# Patient Record
Sex: Male | Born: 1995
Health system: Southern US, Community
[De-identification: ages and names within clinical notes are randomized; demographics above are authoritative.]

## PROBLEM LIST (undated history)

## (undated) DIAGNOSIS — K3532 Acute appendicitis with perforation and localized peritonitis, without abscess: Secondary | ICD-10-CM

## (undated) HISTORY — DX: Acute appendicitis with perforation, localized peritonitis, and gangrene, without abscess: K35.32

## (undated) HISTORY — PX: APPENDECTOMY: SHX54

---
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
## 2017-03-27 DIAGNOSIS — Z23 Encounter for immunization: Secondary | ICD-10-CM | POA: Diagnosis not present

## 2017-09-24 ENCOUNTER — Other Ambulatory Visit: Payer: Self-pay | Admitting: Family Medicine

## 2017-09-24 ENCOUNTER — Ambulatory Visit
Admission: RE | Admit: 2017-09-24 | Discharge: 2017-09-24 | Disposition: A | Discharge status: Home / Self Care | Payer: Commercial Managed Care - PPO | Source: Ambulatory Visit | Attending: Family Medicine | Admitting: Family Medicine

## 2017-09-24 ENCOUNTER — Other Ambulatory Visit: Payer: Self-pay

## 2017-09-24 ENCOUNTER — Encounter (HOSPITAL_COMMUNITY): Payer: Self-pay | Admitting: Emergency Medicine

## 2017-09-24 ENCOUNTER — Inpatient Hospital Stay (HOSPITAL_COMMUNITY)
Admission: EM | Admit: 2017-09-24 | Discharge: 2017-09-29 | DRG: 372 | Disposition: A | Discharge status: Home / Self Care | Payer: Commercial Managed Care - PPO | Attending: General Surgery | Admitting: General Surgery

## 2017-09-24 ENCOUNTER — Observation Stay (HOSPITAL_COMMUNITY): Payer: Commercial Managed Care - PPO

## 2017-09-24 DIAGNOSIS — K3533 Acute appendicitis with perforation and localized peritonitis, with abscess: Principal | ICD-10-CM | POA: Diagnosis present

## 2017-09-24 DIAGNOSIS — K567 Ileus, unspecified: Secondary | ICD-10-CM | POA: Diagnosis present

## 2017-09-24 DIAGNOSIS — R188 Other ascites: Secondary | ICD-10-CM | POA: Diagnosis present

## 2017-09-24 DIAGNOSIS — R1031 Right lower quadrant pain: Secondary | ICD-10-CM

## 2017-09-24 DIAGNOSIS — K3589 Other acute appendicitis without perforation or gangrene: Secondary | ICD-10-CM

## 2017-09-24 DIAGNOSIS — K3532 Acute appendicitis with perforation and localized peritonitis, without abscess: Secondary | ICD-10-CM | POA: Diagnosis present

## 2017-09-24 DIAGNOSIS — L02211 Cutaneous abscess of abdominal wall: Secondary | ICD-10-CM | POA: Diagnosis not present

## 2017-09-24 LAB — CBC WITH DIFFERENTIAL/PLATELET
BASOS PCT: 0 %
Basophils Absolute: 0 10*3/uL (ref 0.0–0.1)
EOS ABS: 0 10*3/uL (ref 0.0–0.7)
Eosinophils Relative: 0 %
HCT: 42.6 % (ref 39.0–52.0)
HEMOGLOBIN: 14.8 g/dL (ref 13.0–17.0)
Lymphocytes Relative: 6 %
Lymphs Abs: 0.8 10*3/uL (ref 0.7–4.0)
MCH: 30.3 pg (ref 26.0–34.0)
MCHC: 34.7 g/dL (ref 30.0–36.0)
MCV: 87.1 fL (ref 78.0–100.0)
MONO ABS: 1.4 10*3/uL — AB (ref 0.1–1.0)
MONOS PCT: 11 %
Neutro Abs: 11.4 10*3/uL — ABNORMAL HIGH (ref 1.7–7.7)
Neutrophils Relative %: 83 %
PLATELETS: 245 10*3/uL (ref 150–400)
RBC: 4.89 MIL/uL (ref 4.22–5.81)
RDW: 11.6 % (ref 11.5–15.5)
WBC: 13.7 10*3/uL — ABNORMAL HIGH (ref 4.0–10.5)

## 2017-09-24 LAB — BASIC METABOLIC PANEL
Anion gap: 16 — ABNORMAL HIGH (ref 5–15)
BUN: 7 mg/dL (ref 6–20)
CALCIUM: 9.2 mg/dL (ref 8.9–10.3)
CO2: 21 mmol/L — ABNORMAL LOW (ref 22–32)
CREATININE: 0.76 mg/dL (ref 0.61–1.24)
Chloride: 100 mmol/L — ABNORMAL LOW (ref 101–111)
Glucose, Bld: 100 mg/dL — ABNORMAL HIGH (ref 65–99)
Potassium: 3.5 mmol/L (ref 3.5–5.1)
SODIUM: 137 mmol/L (ref 135–145)

## 2017-09-24 LAB — PROTIME-INR
INR: 1.09
Prothrombin Time: 14 s (ref 11.4–15.2)

## 2017-09-24 LAB — CREATININE, SERUM
Creatinine, Ser: 0.76 mg/dL (ref 0.61–1.24)
GFR calc Af Amer: >60 mL/min (ref >60)
GFR calc non Af Amer: >60 mL/min (ref >60)

## 2017-09-24 MED ORDER — MORPHINE SULFATE (PF) 2 MG/ML IV SOLN
1.0000 mg | INTRAVENOUS | Status: DC | PRN
  Filled 2017-09-24: qty 1

## 2017-09-24 MED ORDER — ACETAMINOPHEN 325 MG PO TABS
650.0000 mg | ORAL_TABLET | Freq: Four times a day (QID) | ORAL | Status: DC | PRN
  Administered 2017-09-25: 650 mg via ORAL

## 2017-09-24 MED ORDER — SODIUM CHLORIDE 0.45 % IV SOLN
INTRAVENOUS | Status: DC
  Administered 2017-09-24: 18:00:00 via INTRAVENOUS
  Administered 2017-09-25: 125 mL/h via INTRAVENOUS
  Administered 2017-09-27 – 2017-09-28 (×2): via INTRAVENOUS

## 2017-09-24 MED ORDER — SIMETHICONE 80 MG PO CHEW
40.0000 mg | CHEWABLE_TABLET | Freq: Four times a day (QID) | ORAL | Status: DC | PRN
  Filled 2017-09-24: qty 1

## 2017-09-24 MED ORDER — IOPAMIDOL (ISOVUE-300) INJECTION 61%
100.0000 mL | Freq: Once | INTRAVENOUS | Status: AC | PRN
  Administered 2017-09-24: 100 mL via INTRAVENOUS

## 2017-09-24 MED ORDER — SODIUM CHLORIDE 0.9% FLUSH
5.0000 mL | Freq: Three times a day (TID) | INTRAVENOUS | Status: DC
  Administered 2017-09-24 – 2017-09-29 (×13): 5 mL

## 2017-09-24 MED ORDER — PIPERACILLIN-TAZOBACTAM 3.375 G IVPB 30 MIN: 3.3750 g | Freq: Three times a day (TID) | INTRAVENOUS | Status: DC

## 2017-09-24 MED ORDER — PIPERACILLIN-TAZOBACTAM 3.375 G IVPB
3.3750 g | Freq: Three times a day (TID) | INTRAVENOUS | Status: DC
  Administered 2017-09-24 – 2017-09-28 (×11): 3.375 g via INTRAVENOUS
  Filled 2017-09-24 (×11): qty 50

## 2017-09-24 MED ORDER — PIPERACILLIN-TAZOBACTAM 3.375 G IVPB 30 MIN
3.3750 g | Freq: Once | INTRAVENOUS | Status: AC
  Administered 2017-09-24: 3.375 g via INTRAVENOUS
  Filled 2017-09-24: qty 50

## 2017-09-24 MED ORDER — DIPHENHYDRAMINE HCL 50 MG/ML IJ SOLN: 25.0000 mg | Freq: Four times a day (QID) | INTRAMUSCULAR | Status: DC | PRN

## 2017-09-24 MED ORDER — MIDAZOLAM HCL 2 MG/2ML IJ SOLN
INTRAMUSCULAR | Status: AC | PRN
  Administered 2017-09-24 (×3): 1 mg via INTRAVENOUS

## 2017-09-24 MED ORDER — SODIUM CHLORIDE 0.9 % IV BOLUS
1000.0000 mL | Freq: Once | INTRAVENOUS | Status: AC
  Administered 2017-09-24: 1000 mL via INTRAVENOUS

## 2017-09-24 MED ORDER — ACETAMINOPHEN 650 MG RE SUPP: 650.0000 mg | Freq: Four times a day (QID) | RECTAL | Status: DC | PRN

## 2017-09-24 MED ORDER — FENTANYL CITRATE (PF) 100 MCG/2ML IJ SOLN
INTRAMUSCULAR | Status: AC
  Filled 2017-09-24: qty 4

## 2017-09-24 MED ORDER — ONDANSETRON 4 MG PO TBDP: 4.0000 mg | ORAL_TABLET | Freq: Four times a day (QID) | ORAL | Status: DC | PRN

## 2017-09-24 MED ORDER — ENOXAPARIN SODIUM 40 MG/0.4ML ~~LOC~~ SOLN
40.0000 mg | SUBCUTANEOUS | Status: DC
  Administered 2017-09-26 – 2017-09-29 (×4): 40 mg via SUBCUTANEOUS
  Filled 2017-09-24 (×5): qty 0.4

## 2017-09-24 MED ORDER — HYDRALAZINE HCL 20 MG/ML IJ SOLN: 10.0000 mg | INTRAMUSCULAR | Status: DC | PRN

## 2017-09-24 MED ORDER — DIPHENHYDRAMINE HCL 25 MG PO CAPS: 25.0000 mg | ORAL_CAPSULE | Freq: Four times a day (QID) | ORAL | Status: DC | PRN

## 2017-09-24 MED ORDER — ONDANSETRON HCL 4 MG/2ML IJ SOLN
4.0000 mg | Freq: Once | INTRAMUSCULAR | Status: AC
  Administered 2017-09-24: 4 mg via INTRAVENOUS
  Filled 2017-09-24: qty 2

## 2017-09-24 MED ORDER — ONDANSETRON HCL 4 MG/2ML IJ SOLN
4.0000 mg | Freq: Four times a day (QID) | INTRAMUSCULAR | Status: DC | PRN
  Administered 2017-09-24 – 2017-09-26 (×3): 4 mg via INTRAVENOUS
  Filled 2017-09-24 (×4): qty 2

## 2017-09-24 MED ORDER — MIDAZOLAM HCL 2 MG/2ML IJ SOLN
INTRAMUSCULAR | Status: AC
  Filled 2017-09-24: qty 4

## 2017-09-24 MED ORDER — FENTANYL CITRATE (PF) 100 MCG/2ML IJ SOLN
INTRAMUSCULAR | Status: AC | PRN
  Administered 2017-09-24 (×2): 50 ug via INTRAVENOUS

## 2017-09-24 MED ORDER — METOPROLOL TARTRATE 5 MG/5ML IV SOLN: 5.0000 mg | Freq: Four times a day (QID) | INTRAVENOUS | Status: DC | PRN

## 2017-09-24 NOTE — Consult Note (Signed)
Chief Complaint: Patient was seen in consultation today for CT-guided drainage of periappendiceal abscess Chief Complaint  Patient presents with  . appendicitis    Referring Physician(s): White,C  Supervising Physician: Oley Balm  Patient Status: [facility] - ED  History of Present Illness: Bobby Owens is a 22 y.o. male who presented to the [facility]  ED today as a referral from primary care physician secondary to perforated appendicitis.  Patient began having abdominal pain on 4/29 which persisted and eventually localized to right lower quadrant.  Also with associated nausea and vomiting.  Subsequent imaging revealed periappendiceal abscess measuring 5.7 x 4.3 x 4.1 cm.  Also present was mild ascites in the dependent portion of the pelvis.  There was no bowel obstruction or free air.  Patient is currently afebrile.  WBC 13.7.  Request now received from surgery for image guided drainage of the periappendiceal abscess.  History reviewed. No pertinent past medical history.  History reviewed. No pertinent surgical history.  Allergies: Patient has no known allergies.  Medications: Prior to Admission medications   Not on File     No family history on file.  Social History   Socioeconomic History  . Marital status: Single    Spouse name: Not on file  . Number of children: Not on file  . Years of education: Not on file  . Highest education level: Not on file  Occupational History  . Not on file  Social Needs  . Financial resource strain: Not on file  . Food insecurity:    Worry: Not on file    Inability: Not on file  . Transportation needs:    Medical: Not on file    Non-medical: Not on file  Tobacco Use  . Smoking status: Never Smoker  . Smokeless tobacco: Never Used  Substance and Sexual Activity  . Alcohol use: Not on file  . Drug use: Not on file  . Sexual activity: Not on file  Lifestyle  . Physical activity:    Days per week: Not on file    Minutes per  session: Not on file  . Stress: Not on file  Relationships  . Social connections:    Talks on phone: Not on file    Gets together: Not on file    Attends religious service: Not on file    Active member of club or organization: Not on file    Attends meetings of clubs or organizations: Not on file    Relationship status: Not on file  Other Topics Concern  . Not on file  Social History Narrative  . Not on file      Review of Systems see above; denies headache, chest pain, dyspnea, cough, back pain, or abnormal bleeding  Vital Signs: BP (!) 142/73   Pulse 90   Temp 98.5 F (36.9 C) (Oral)   Resp 14   Ht 5' 7.5" (1.715 m)   Wt 115 lb (52.2 kg)   SpO2 100%   BMI 17.75 kg/m   Physical Exam awake, alert.  Chest clear to auscultation bilaterally.  Heart with regular rate and rhythm.  Abdomen soft, positive bowel sounds, mod tender right lower quadrant; no lower extremity edema  Imaging: Ct Abdomen Pelvis W Contrast  Result Date: 09/24/2017 CLINICAL DATA:  Right lower quadrant pain EXAM: CT ABDOMEN AND PELVIS WITH CONTRAST TECHNIQUE: Multidetector CT imaging of the abdomen and pelvis was performed using the standard protocol following bolus administration of intravenous contrast. CONTRAST:  100 mL Isovue  370 nonionic COMPARISON:  None. FINDINGS: Lower chest: Lung bases are clear. Hepatobiliary: No focal liver lesions are apparent. Gallbladder wall is not appreciably thickened. There is no biliary duct dilatation. Pancreas: No pancreatic mass or inflammatory focus. Spleen: No splenic lesions are evident. Adrenals/Urinary Tract: Adrenals bilaterally appear normal. Kidneys bilaterally show no evident mass or hydronephrosis on either side. There is no renal or ureteral calculus on either side. Urinary bladder is midline with wall thickness within normal limits. Stomach/Bowel: There is medial proximal colonic wall thickening due to adjacent appendiceal abscess. There is no other appreciable  bowel wall thickening. No bowel obstruction evident. No free air or portal venous air. Vascular/Lymphatic: No abdominal aortic aneurysm. No vascular lesions are evident. There is no adenopathy in the abdomen or pelvis. Reproductive: Prostate and seminal vesicles appear normal in size and contour. No pelvic mass. Other: There is a focal abscess in the right lower quadrant at the site of the appendix measuring 5.7 x 4.3 x 4.1 cm. No other abscess seen elsewhere. There is mild ascites in the dependent portion of the pelvis. Musculoskeletal: No blastic or lytic bone lesions are evident. No intramuscular or abdominal wall lesion evident. IMPRESSION: 1. Periappendiceal abscess measuring 5.7 x 4.3 x 4.1 cm. No other abscess elsewhere. 2.  Mild ascites in the dependent portion of the pelvis. 3.  No bowel obstruction.  No free air. Critical Value/emergent results were called by telephone at the time of interpretation on 09/24/2017 at 12:42 pm to Dr. Joycelyn Rua , who verbally acknowledged these results. Electronically Signed   By: Bretta Bang III M.D.   On: 09/24/2017 12:42    Labs:  CBC: Recent Labs    09/24/17 1348  WBC 13.7*  HGB 14.8  HCT 42.6  PLT 245    COAGS: No results for input(s): INR, APTT in the last 8760 hours.  BMP: Recent Labs    09/24/17 1348  NA 137  K 3.5  CL 100*  CO2 21*  GLUCOSE 100*  BUN 7  CALCIUM 9.2  CREATININE 0.76  0.76  GFRNONAA >60  >60  GFRAA >60  >60    LIVER FUNCTION TESTS: No results for input(s): BILITOT, AST, ALT, ALKPHOS, PROT, ALBUMIN in the last 8760 hours.  TUMOR MARKERS: No results for input(s): AFPTM, CEA, CA199, CHROMGRNA in the last 8760 hours.  Assessment and Plan: 22 y.o. male who presented to the [facility]  ED today as a referral from primary care physician secondary to perforated appendicitis.  Patient began having abdominal pain on 4/29 which persisted and eventually localized to right lower quadrant.  Also with associated  nausea and vomiting.  Subsequent imaging revealed periappendiceal abscess measuring 5.7 x 4.3 x 4.1 cm.  Also present was mild ascites in the dependent portion of the pelvis.  There was no bowel obstruction or free air.  Patient is currently afebrile.  WBC 13.7.  Request now received from surgery for image guided drainage of the periappendiceal abscess.  Imaging studies have been reviewed by Dr. Deanne Coffer. Risks and benefits discussed with the patient/father including bleeding, infection, damage to adjacent structures, bowel perforation/fistula connection, and sepsis.  All of the patient's questions were answered, patient is agreeable to proceed. Consent signed and in chart.  Procedure scheduled for this afternoon    Thank you for this interesting consult.  I greatly enjoyed meeting Bobby Owens and look forward to participating in their care.  A copy of this report was sent to the requesting provider on  this date.  Electronically Signed: D. Jeananne Rama, PA-C 09/24/2017, 3:44 PM   I spent a total of 30 minutes    in face to face in clinical consultation, greater than 50% of which was counseling/coordinating care for CT guided drainage of periappendiceal abscess

## 2017-09-24 NOTE — ED Triage Notes (Signed)
Pt went to [facility] PCP today who sent to [facility] imaging. Pt has appendicitis and sent to ED. Pt reports since Monday abd pains and today started having n/v.

## 2017-09-24 NOTE — H&P (Signed)
[facility] Admission Note  Bobby Owens 1996/02/22  [id number].    Requesting MD: Dene Gentry Chief Complaint/Reason for Consult: perforated appendicitis  HPI:  Bobby Owens is a 22yo male who was sent to the [facility] from his PCP's office today with perforated appendicitis. Patient's father is at bedside. States that he started having central abdominal pain on Monday. It has been constant with intermittent sharp/stabbing pain with associated nausea and vomiting. The pain has now localized to his RLQ. States that he has never had pain like this before. Saw PCP this morning who ordered an outpatient CT scan which showed periappendiceal abscess measuring 5.7 x 4.3 x 4.1 cm. He was sent to the ED for evaluation by general surgery. WBC 13.7 He is NPO today.  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  HENT: Negative.   Eyes: Negative.   Respiratory: Negative.   Cardiovascular: Negative.   Gastrointestinal: Positive for abdominal pain, nausea and vomiting. Negative for constipation and diarrhea.  Genitourinary: Negative.   Musculoskeletal: Negative.   Skin: Negative.   Neurological: Negative.    All systems reviewed and otherwise negative except for as above  No family history on file.  History reviewed. No pertinent past medical history.  History reviewed. No pertinent surgical history.  Social History:  reports that he has never smoked. He has never used smokeless tobacco. His alcohol and drug histories are not on file.  Allergies: No Known Allergies   (Not in a hospital admission)  Prior to Admission medications   Not on File    Blood pressure (!) 150/77, pulse (!) 108, temperature 98.5 F (36.9 C), temperature source Oral, resp. rate 20, height 5' 7.5" (1.715 m), weight 115 lb (52.2 kg), SpO2 100  %. Physical Exam: General: pleasant, WD/WN male who is laying in bed in NAD but appears uncomfortable HEENT: head is normocephalic, atraumatic.  Sclera are noninjected.  Pupils equal and round.  Ears and nose without any masses or lesions.  Mouth is pink and moist. Dentition fair Heart: regular, rate, and rhythm.  No obvious murmurs, gallops, or rubs noted.  Palpable pedal pulses bilaterally Lungs: CTAB, no wheezes, rhonchi, or rales noted.  Respiratory effort nonlabored Abd: incisions, soft, ND, +BS, no masses, hernias, or organomegaly. +TTP RLQ with some guarding. No other TTP in the abdomen. MS: all 4 extremities are symmetrical with no cyanosis, clubbing, or edema. Skin: warm and dry with no masses, lesions, or rashes Psych: A&Ox3 with an appropriate affect. Neuro: cranial nerves grossly intact, extremity CSM intact bilaterally, normal speech  Results for orders placed or performed during the hospital encounter of 09/24/17 (from the past 48 hour(s))  CBC with Differential     Status: Abnormal   Collection Time: 09/24/17  1:48 PM  Result Value Ref Range   WBC 13.7 (H) 4.0 - 10.5 K/uL   RBC 4.89 4.22 - 5.81 MIL/uL   Hemoglobin 14.8 13.0 - 17.0 g/dL   HCT 42.6 39.0 - 52.0 %   MCV 87.1 78.0 - 100.0 fL   MCH 30.3 26.0 - 34.0 pg   MCHC 34.7 30.0 - 36.0 g/dL   RDW 11.6 11.5 - 15.5 %   Platelets 245 150 - 400 K/uL   Neutrophils Relative % 83 %   Neutro Abs 11.4 (H) 1.7 - 7.7 K/uL   Lymphocytes Relative 6 %   Lymphs Abs 0.8  0.7 - 4.0 K/uL   Monocytes Relative 11 %   Monocytes Absolute 1.4 (H) 0.1 - 1.0 K/uL   Eosinophils Relative 0 %   Eosinophils Absolute 0.0 0.0 - 0.7 K/uL   Basophils Relative 0 %   Basophils Absolute 0.0 0.0 - 0.1 K/uL    Comment: Performed at [facility], [place] [address], [place], [place] [address]  Basic metabolic panel     Status: Abnormal   Collection Time: 09/24/17  1:48 PM  Result Value Ref Range   Sodium 137 135 - 145 mmol/L   Potassium  3.5 3.5 - 5.1 mmol/L   Chloride 100 (L) 101 - 111 mmol/L   CO2 21 (L) 22 - 32 mmol/L   Glucose, Bld 100 (H) 65 - 99 mg/dL   BUN 7 6 - 20 mg/dL   Creatinine, Ser 0.76 0.61 - 1.24 mg/dL   Calcium 9.2 8.9 - 10.3 mg/dL   GFR calc non Af Amer >60 >60 mL/min   GFR calc Af Amer >60 >60 mL/min    Comment: (NOTE) The eGFR has been calculated using the CKD EPI equation. This calculation has not been validated in all clinical situations. eGFR's persistently <60 mL/min signify possible Chronic Kidney Disease.    Anion gap 16 (H) 5 - 15    Comment: Performed at [facility], [place] [address], [place], [place] [address]   Ct Abdomen Pelvis W Contrast  Result Date: 09/24/2017 CLINICAL DATA:  Right lower quadrant pain EXAM: CT ABDOMEN AND PELVIS WITH CONTRAST TECHNIQUE: Multidetector CT imaging of the abdomen and pelvis was performed using the standard protocol following bolus administration of intravenous contrast. CONTRAST:  100 mL Isovue 370 nonionic COMPARISON:  None. FINDINGS: Lower chest: Lung bases are clear. Hepatobiliary: No focal liver lesions are apparent. Gallbladder wall is not appreciably thickened. There is no biliary duct dilatation. Pancreas: No pancreatic mass or inflammatory focus. Spleen: No splenic lesions are evident. Adrenals/Urinary Tract: Adrenals bilaterally appear normal. Kidneys bilaterally show no evident mass or hydronephrosis on either side. There is no renal or ureteral calculus on either side. Urinary bladder is midline with wall thickness within normal limits. Stomach/Bowel: There is medial proximal colonic wall thickening due to adjacent appendiceal abscess. There is no other appreciable bowel wall thickening. No bowel obstruction evident. No free air or portal venous air. Vascular/Lymphatic: No abdominal aortic aneurysm. No vascular lesions are evident. There is no adenopathy in the abdomen or pelvis. Reproductive: Prostate and seminal vesicles appear normal in  size and contour. No pelvic mass. Other: There is a focal abscess in the right lower quadrant at the site of the appendix measuring 5.7 x 4.3 x 4.1 cm. No other abscess seen elsewhere. There is mild ascites in the dependent portion of the pelvis. Musculoskeletal: No blastic or lytic bone lesions are evident. No intramuscular or abdominal wall lesion evident. IMPRESSION: 1. Periappendiceal abscess measuring 5.7 x 4.3 x 4.1 cm. No other abscess elsewhere. 2.  Mild ascites in the dependent portion of the pelvis. 3.  No bowel obstruction.  No free air. Critical Value/emergent results were called by telephone at the time of interpretation on 09/24/2017 at 12:42 pm to Dr. Orpah Melter , who verbally acknowledged these results. Electronically Signed   By: Lowella Grip III M.D.   On: 09/24/2017 12:42      Assessment/Plan Perforated appendicitis with abscess - 4 days of abdominal pain, nausea, vomiting - CT scan which showed periappendiceal abscess measuring 5.7 x 4.3  x 4.1 cm - WBC 13.7, afebrile  ID - zosyn 5/2>> VTE - SCDs, lovenox FEN - IVF, NPO Foley - none  Plan - Discussed with MD. Patient does not appear to have peritonitis on exam. Admit to med-surg. Will consult IR for percutaneous drain. Continue IV zosyn, IVF, and keep NPO.   Wellington Hampshire, [facility] 09/24/2017, 2:31 PM Pager: [phone] Consults: [phone] Mon-Fri 7:00 am-4:30 pm Sat-Sun 7:00 am-11:30 am

## 2017-09-24 NOTE — ED Provider Notes (Signed)
[facility]-EMERGENCY DEPT Provider Note   CSN: [id number] Arrival date & time: 09/24/17  1320     History   Chief Complaint Chief Complaint  Patient presents with  . appendicitis    HPI Bobby Owens is a 22 y.o. male.  22 year old male without prior medical history presents with complaint of abdominal pain.  Patient reports pain since 4 days prior.  Patient was seen by his primary today and they arranged an outpatient CT.  CT imaging done as an outpatient showed preforated appendicitis with peri-appendiceal abscess.  Patient now complains of right lower quadrant pain.  He denies nausea at this time.  He declines pain medicine at this time.  The history is provided by the patient and medical records.  Abdominal Pain   This is a new problem. The current episode started more than 2 days ago. The problem occurs constantly. The problem has not changed since onset.The pain is located in the RLQ. The pain is moderate. Pertinent negatives include fever. Nothing aggravates the symptoms. Nothing relieves the symptoms. Past workup includes CT scan.    History reviewed. No pertinent past medical history.  There are no active problems to display for this patient.   History reviewed. No pertinent surgical history.      Home Medications    Prior to Admission medications   Not on File    Family History No family history on file.  Social History Social History   Tobacco Use  . Smoking status: Never Smoker  . Smokeless tobacco: Never Used  Substance Use Topics  . Alcohol use: Not on file  . Drug use: Not on file     Allergies   Patient has no known allergies.   Review of Systems Review of Systems  Constitutional: Negative for fever.  Gastrointestinal: Positive for abdominal pain.  All other systems reviewed and are negative.    Physical Exam Updated Vital Signs BP (!) 144/92 (BP Location: Left Arm)   Pulse 100   Temp 98.5 F (36.9 C) (Oral)    Resp 20   Ht 5' 7.5" (1.715 m)   Wt 52.2 kg (115 lb)   SpO2 100%   BMI 17.75 kg/m   Physical Exam  Constitutional: He is oriented to person, place, and time. He appears well-developed and well-nourished. No distress.  HENT:  Head: Normocephalic and atraumatic.  Mouth/Throat: Oropharynx is clear and moist.  Eyes: Pupils are equal, round, and reactive to light. Conjunctivae and EOM are normal.  Neck: Normal range of motion. Neck supple.  Cardiovascular: Normal rate, regular rhythm and normal heart sounds.  Pulmonary/Chest: Effort normal and breath sounds normal. No respiratory distress.  Abdominal: Soft. He exhibits no distension. There is tenderness.  Tender at right lower quadrant  Musculoskeletal: Normal range of motion. He exhibits no edema or deformity.  Neurological: He is alert and oriented to person, place, and time.  Skin: Skin is warm and dry.  Psychiatric: He has a normal mood and affect.  Nursing note and vitals reviewed.    ED Treatments / Results  Labs (all labs ordered are listed, but only abnormal results are displayed) Labs Reviewed  CBC WITH DIFFERENTIAL/PLATELET  BASIC METABOLIC PANEL    EKG None  Radiology Ct Abdomen Pelvis W Contrast  Result Date: 09/24/2017 CLINICAL DATA:  Right lower quadrant pain EXAM: CT ABDOMEN AND PELVIS WITH CONTRAST TECHNIQUE: Multidetector CT imaging of the abdomen and pelvis was performed using the standard protocol following bolus administration of intravenous contrast.  CONTRAST:  100 mL Isovue 370 nonionic COMPARISON:  None. FINDINGS: Lower chest: Lung bases are clear. Hepatobiliary: No focal liver lesions are apparent. Gallbladder wall is not appreciably thickened. There is no biliary duct dilatation. Pancreas: No pancreatic mass or inflammatory focus. Spleen: No splenic lesions are evident. Adrenals/Urinary Tract: Adrenals bilaterally appear normal. Kidneys bilaterally show no evident mass or hydronephrosis on either side. There  is no renal or ureteral calculus on either side. Urinary bladder is midline with wall thickness within normal limits. Stomach/Bowel: There is medial proximal colonic wall thickening due to adjacent appendiceal abscess. There is no other appreciable bowel wall thickening. No bowel obstruction evident. No free air or portal venous air. Vascular/Lymphatic: No abdominal aortic aneurysm. No vascular lesions are evident. There is no adenopathy in the abdomen or pelvis. Reproductive: Prostate and seminal vesicles appear normal in size and contour. No pelvic mass. Other: There is a focal abscess in the right lower quadrant at the site of the appendix measuring 5.7 x 4.3 x 4.1 cm. No other abscess seen elsewhere. There is mild ascites in the dependent portion of the pelvis. Musculoskeletal: No blastic or lytic bone lesions are evident. No intramuscular or abdominal wall lesion evident. IMPRESSION: 1. Periappendiceal abscess measuring 5.7 x 4.3 x 4.1 cm. No other abscess elsewhere. 2.  Mild ascites in the dependent portion of the pelvis. 3.  No bowel obstruction.  No free air. Critical Value/emergent results were called by telephone at the time of interpretation on 09/24/2017 at 12:42 pm to Dr. Joycelyn Rua , who verbally acknowledged these results. Electronically Signed   By: Bretta Bang III M.D.   On: 09/24/2017 12:42    Procedures Procedures (including critical care time)  Medications Ordered in ED Medications  sodium chloride 0.9 % bolus 1,000 mL (1,000 mLs Intravenous New Bag/Given 09/24/17 1348)  piperacillin-tazobactam (ZOSYN) IVPB 3.375 g (3.375 g Intravenous New Bag/Given 09/24/17 1348)     Initial Impression / Assessment and Plan / ED Course  I have reviewed the triage vital signs and the nursing notes.  Pertinent labs & imaging results that were available during my care of the patient were reviewed by me and considered in my medical decision making (see chart for details).     MDM  Screen  complete  Patient is arriving with CT proven appendicitis with periappendiceal abscess formation.  Patient will be admitted to surgery.    Case discussed with [facility] and they will evaluate the patient for admission.  Final Clinical Impressions(s) / ED Diagnoses   Final diagnoses:  Other acute appendicitis  Appendiceal abscess    ED Discharge Orders    None       Wynetta Fines, MD 09/24/17 1357

## 2017-09-24 NOTE — Procedures (Signed)
  Procedure: CT guided RLQ abscess drain  58f EBL:   minimal Complications:  none immediate  See full dictation in [employer].  Thora Lance MD Main # [phone] Pager  [phone]

## 2017-09-24 NOTE — ED Notes (Signed)
RN to [facility] for report

## 2017-09-24 NOTE — Progress Notes (Signed)
MEDICATION-RELATED CONSULT NOTE   IR Procedure Consult - Anticoagulant/Antiplatelet PTA/Inpatient Med List Review by Pharmacist    Procedure: CT guided RLQ abscess drain     Completed: 09/24/17 at 1716  Post-Procedural bleeding risk per IR MD assessment:  standard  Antithrombotic medications on inpatient or PTA profile prior to procedure:    Enoxaparin 40 mg  daily    Recommended restart time per IR Post-Procedure Guidelines:   - Start 1 day after procedure   Other considerations:      Plan:     Enoxparin already scheduled to start on 5/3. Continue as currently ordered.  Adalberto Cole, PharmD, BCPS Pager [phone] 09/24/2017 5:29 PM

## 2017-09-25 DIAGNOSIS — K3533 Acute appendicitis with perforation and localized peritonitis, with abscess: Secondary | ICD-10-CM | POA: Diagnosis not present

## 2017-09-25 LAB — BASIC METABOLIC PANEL
ANION GAP: 13 (ref 5–15)
BUN: 6 mg/dL (ref 6–20)
CO2: 20 mmol/L — AB (ref 22–32)
Calcium: 8.8 mg/dL — ABNORMAL LOW (ref 8.9–10.3)
Chloride: 104 mmol/L (ref 101–111)
Creatinine, Ser: 0.82 mg/dL (ref 0.61–1.24)
GFR calc Af Amer: >60 mL/min (ref >60)
GFR calc non Af Amer: >60 mL/min (ref >60)
GLUCOSE: 110 mg/dL — AB (ref 65–99)
POTASSIUM: 3.8 mmol/L (ref 3.5–5.1)
Sodium: 137 mmol/L (ref 135–145)

## 2017-09-25 LAB — CBC
HCT: 42.4 % (ref 39.0–52.0)
HEMOGLOBIN: 14.6 g/dL (ref 13.0–17.0)
MCH: 30.1 pg (ref 26.0–34.0)
MCHC: 34.4 g/dL (ref 30.0–36.0)
MCV: 87.4 fL (ref 78.0–100.0)
Platelets: 277 10*3/uL (ref 150–400)
RBC: 4.85 MIL/uL (ref 4.22–5.81)
RDW: 11.8 % (ref 11.5–15.5)
WBC: 13.1 10*3/uL — ABNORMAL HIGH (ref 4.0–10.5)

## 2017-09-25 LAB — HIV ANTIBODY (ROUTINE TESTING W REFLEX): HIV Screen 4th Generation wRfx: NONREACTIVE

## 2017-09-25 MED ORDER — IBUPROFEN 200 MG PO TABS
600.0000 mg | ORAL_TABLET | Freq: Three times a day (TID) | ORAL | Status: DC
  Administered 2017-09-25: 600 mg via ORAL
  Filled 2017-09-25 (×4): qty 3

## 2017-09-25 MED ORDER — PROMETHAZINE HCL 25 MG PO TABS
12.5000 mg | ORAL_TABLET | Freq: Three times a day (TID) | ORAL | Status: DC | PRN
Start: 2017-09-25 — End: 2017-09-29
  Filled 2017-09-25: qty 1

## 2017-09-25 MED ORDER — ACETAMINOPHEN 500 MG PO TABS
1000.0000 mg | ORAL_TABLET | Freq: Three times a day (TID) | ORAL | Status: DC
  Filled 2017-09-25 (×3): qty 2

## 2017-09-25 MED ORDER — ACETAMINOPHEN 650 MG RE SUPP: 650.0000 mg | Freq: Three times a day (TID) | RECTAL | Status: DC

## 2017-09-25 NOTE — Progress Notes (Signed)
[facility] Progress Note     Subjective: CC:  Abdominal pain slightly better compared to yesterday, worse around RLQ drain site. pts main complaint is nausea. Denies emesis. Denies flatus or BM. I encouraged patient to ambulate.  TMAX 100.8, mild sinus tachycardia intermittently, normotensive  Objective: Vital signs in last 24 hours: Temp:  [98.3 F (36.8 C)-100.8 F (38.2 C)] 98.6 F (37 C) (05/03 0538) Pulse Rate:  [77-118] 82 (05/03 0538) Resp:  [1-26] 1 (05/03 0538) BP: (125-150)/(60-92) 130/69 (05/03 0538) SpO2:  [97 %-100 %] 99 % (05/03 0538) Weight:  [52.2 kg (115 lb)] 52.2 kg (115 lb) (05/02 1326) Last BM Date: 09/23/17  Intake/Output from previous day: 05/02 0701 - 05/03 0700 In: 363.3 [I.V.:313.3; IV Piggyback:50] Out: 910 [Urine:900; Drains:10] Intake/Output this shift: No intake/output data recorded.  PE: Gen:  Alert, NAD, cooperative Card:  Regular rate and rhythm, pedal pulses 2+ BL Pulm:  Normal effort, clear to auscultation bilaterally Abd: Soft, TTP LLQ and RLQ without peritonitis, RLQ drain with small amount SS drainage, hypoactive BS Skin: warm and dry, no rashes  Psych: A&Ox3   Lab Results:  Recent Labs    09/24/17 1348 09/25/17 0459  WBC 13.7* 13.1*  HGB 14.8 14.6  HCT 42.6 42.4  PLT 245 277   BMET Recent Labs    09/24/17 1348 09/25/17 0459  NA 137 137  K 3.5 3.8  CL 100* 104  CO2 21* 20*  GLUCOSE 100* 110*  BUN 7 6  CREATININE 0.76  0.76 0.82  CALCIUM 9.2 8.8*   PT/INR Recent Labs    09/24/17 1738  LABPROT 14.0  INR 1.09   CMP     Component Value Date/Time   NA 137 09/25/2017 0459   K 3.8 09/25/2017 0459   CL 104 09/25/2017 0459   CO2 20 (L) 09/25/2017 0459   GLUCOSE 110 (H) 09/25/2017 0459   BUN 6 09/25/2017 0459   CREATININE 0.82 09/25/2017 0459   CALCIUM 8.8 (L) 09/25/2017 0459   GFRNONAA >60 09/25/2017 0459   GFRAA >60 09/25/2017 0459   Lipase  No results found for:  LIPASE  Studies/Results: Ct Abdomen Pelvis W Contrast  Result Date: 09/24/2017 CLINICAL DATA:  Right lower quadrant pain EXAM: CT ABDOMEN AND PELVIS WITH CONTRAST TECHNIQUE: Multidetector CT imaging of the abdomen and pelvis was performed using the standard protocol following bolus administration of intravenous contrast. CONTRAST:  100 mL Isovue 370 nonionic COMPARISON:  None. FINDINGS: Lower chest: Lung bases are clear. Hepatobiliary: No focal liver lesions are apparent. Gallbladder wall is not appreciably thickened. There is no biliary duct dilatation. Pancreas: No pancreatic mass or inflammatory focus. Spleen: No splenic lesions are evident. Adrenals/Urinary Tract: Adrenals bilaterally appear normal. Kidneys bilaterally show no evident mass or hydronephrosis on either side. There is no renal or ureteral calculus on either side. Urinary bladder is midline with wall thickness within normal limits. Stomach/Bowel: There is medial proximal colonic wall thickening due to adjacent appendiceal abscess. There is no other appreciable bowel wall thickening. No bowel obstruction evident. No free air or portal venous air. Vascular/Lymphatic: No abdominal aortic aneurysm. No vascular lesions are evident. There is no adenopathy in the abdomen or pelvis. Reproductive: Prostate and seminal vesicles appear normal in size and contour. No pelvic mass. Other: There is a focal abscess in the right lower quadrant at the site of the appendix measuring 5.7 x 4.3 x 4.1 cm. No other abscess seen elsewhere. There is mild ascites in the dependent portion  of the pelvis. Musculoskeletal: No blastic or lytic bone lesions are evident. No intramuscular or abdominal wall lesion evident. IMPRESSION: 1. Periappendiceal abscess measuring 5.7 x 4.3 x 4.1 cm. No other abscess elsewhere. 2.  Mild ascites in the dependent portion of the pelvis. 3.  No bowel obstruction.  No free air. Critical Value/emergent results were called by telephone at the  time of interpretation on 09/24/2017 at 12:42 pm to Dr. Joycelyn Rua , who verbally acknowledged these results. Electronically Signed   By: Bretta Bang III M.D.   On: 09/24/2017 12:42   Ct Image Guided Drainage By Percutaneous Catheter  Result Date: 09/25/2017 CLINICAL DATA:  Appendiceal abscess EXAM: CT GUIDED DRAINAGE OF APPENDICEAL ABSCESS ANESTHESIA/SEDATION: Intravenous Fentanyl and Versed were administered as conscious sedation during continuous monitoring of the patient's level of consciousness and physiological / cardiorespiratory status by the radiology RN, with a total moderate sedation time of 25 minutes. PROCEDURE: The procedure, risks, benefits, and alternatives were explained to the patient. Questions regarding the procedure were encouraged and answered. The patient understands and consents to the procedure. Select axial scans through the lower abdomen obtained. The collection was localized and an appropriate skin entry site was determined and marked. The operative field was prepped with chlorhexidinein a sterile fashion, and a sterile drape was applied covering the operative field. A sterile gown and sterile gloves were used for the procedure. Local anesthesia was provided with 1% Lidocaine. Under real-time ultrasound guidance, a 19 gauge percutaneous entry needle was advanced into the collection. Purulent fluid returned. Amplatz wire advanced easily within the collection, confirmed on CT. Tract dilated to facilitate placement of a 10 French pigtail drain catheter, formed centrally within the collection. 20 mL of purulent aspirate were removed and sent for Gram stain and culture. The patient tolerated the procedure well. COMPLICATIONS: None immediate FINDINGS: The right lower quadrant collection was localized. 10 French drain catheter placed as above. Sample of the aspirate sent for Gram stain and culture. IMPRESSION: 1. Technically successful right lower quadrant appendiceal abscess drain  catheter placement. Electronically Signed   By: Corlis Leak M.D.   On: 09/25/2017 08:14    Anti-infectives: Anti-infectives (From admission, onward)   Start     Dose/Rate Route Frequency Ordered Stop   09/24/17 2200  piperacillin-tazobactam (ZOSYN) IVPB 3.375 g     3.375 g 12.5 mL/hr over 240 Minutes Intravenous Every 8 hours 09/24/17 1438     09/24/17 1445  piperacillin-tazobactam (ZOSYN) IVPB 3.375 g  Status:  Discontinued     3.375 g 100 mL/hr over 30 Minutes Intravenous Every 8 hours 09/24/17 1431 09/24/17 1437   09/24/17 1345  piperacillin-tazobactam (ZOSYN) IVPB 3.375 g     3.375 g 100 mL/hr over 30 Minutes Intravenous  Once 09/24/17 1338 09/24/17 1418     Assessment/Plan Acute appendicitis with perforation and abscess S/P 17F perc drain RLQ - 20 cc purulent fluid aspiration, Cx pending (G+cocci) - low grade temps (100.8), intermittent sinus tachycardia, normotensive - add phenergan for nausea - continue NPO w/ ice chips and limited sips  - continue IV zosyn  - ambulate - BMET/CBC in AM  FEN: NPO +sips/chips, IVF @ 125 cc/hr  ID: Zosyn 5/2 >>; WBC 13.1  VTE: SCD's, lovenox Foley: none    LOS: 0 days    Adam Phenix , [facility] 09/25/2017, 8:40 AM Pager: [phone] Consults: [phone] Mon-Fri 7:00 am-4:30 pm Sat-Sun 7:00 am-11:30 am

## 2017-09-25 NOTE — Progress Notes (Signed)
Referring Physician(s): White,C  Supervising Physician: Simonne Come  Patient Status:  [facility] - In-pt  Chief Complaint:  Abdominal pain, periappendiceal abscess  Subjective: Pt drowsy but arousable; still having some RLQ soreness, occ nausea; has had few ice chips   Allergies: Patient has no known allergies.  Medications: Prior to Admission medications   Not on File     Vital Signs: BP 130/69 (BP Location: Right Arm)   Pulse 82   Temp 98.6 F (37 C) (Oral)   Resp (!) 1   Ht 5' 7.5" (1.715 m)   Wt 115 lb (52.2 kg)   SpO2 99%   BMI 17.75 kg/m   Physical Exam RLQ drain intact, dressing dry, site mild- mod tender, output 10+ cc blood-tinged fluid, drain irrigated with sterile NS without difficulty  Imaging: Ct Abdomen Pelvis W Contrast  Result Date: 09/24/2017 CLINICAL DATA:  Right lower quadrant pain EXAM: CT ABDOMEN AND PELVIS WITH CONTRAST TECHNIQUE: Multidetector CT imaging of the abdomen and pelvis was performed using the standard protocol following bolus administration of intravenous contrast. CONTRAST:  100 mL Isovue 370 nonionic COMPARISON:  None. FINDINGS: Lower chest: Lung bases are clear. Hepatobiliary: No focal liver lesions are apparent. Gallbladder wall is not appreciably thickened. There is no biliary duct dilatation. Pancreas: No pancreatic mass or inflammatory focus. Spleen: No splenic lesions are evident. Adrenals/Urinary Tract: Adrenals bilaterally appear normal. Kidneys bilaterally show no evident mass or hydronephrosis on either side. There is no renal or ureteral calculus on either side. Urinary bladder is midline with wall thickness within normal limits. Stomach/Bowel: There is medial proximal colonic wall thickening due to adjacent appendiceal abscess. There is no other appreciable bowel wall thickening. No bowel obstruction evident. No free air or portal venous air. Vascular/Lymphatic: No abdominal aortic aneurysm. No vascular lesions are evident. There  is no adenopathy in the abdomen or pelvis. Reproductive: Prostate and seminal vesicles appear normal in size and contour. No pelvic mass. Other: There is a focal abscess in the right lower quadrant at the site of the appendix measuring 5.7 x 4.3 x 4.1 cm. No other abscess seen elsewhere. There is mild ascites in the dependent portion of the pelvis. Musculoskeletal: No blastic or lytic bone lesions are evident. No intramuscular or abdominal wall lesion evident. IMPRESSION: 1. Periappendiceal abscess measuring 5.7 x 4.3 x 4.1 cm. No other abscess elsewhere. 2.  Mild ascites in the dependent portion of the pelvis. 3.  No bowel obstruction.  No free air. Critical Value/emergent results were called by telephone at the time of interpretation on 09/24/2017 at 12:42 pm to Dr. Joycelyn Rua , who verbally acknowledged these results. Electronically Signed   By: Bretta Bang III M.D.   On: 09/24/2017 12:42   Ct Image Guided Drainage By Percutaneous Catheter  Result Date: 09/25/2017 CLINICAL DATA:  Appendiceal abscess EXAM: CT GUIDED DRAINAGE OF APPENDICEAL ABSCESS ANESTHESIA/SEDATION: Intravenous Fentanyl and Versed were administered as conscious sedation during continuous monitoring of the patient's level of consciousness and physiological / cardiorespiratory status by the radiology RN, with a total moderate sedation time of 25 minutes. PROCEDURE: The procedure, risks, benefits, and alternatives were explained to the patient. Questions regarding the procedure were encouraged and answered. The patient understands and consents to the procedure. Select axial scans through the lower abdomen obtained. The collection was localized and an appropriate skin entry site was determined and marked. The operative field was prepped with chlorhexidinein a sterile fashion, and a sterile drape was applied covering the  operative field. A sterile gown and sterile gloves were used for the procedure. Local anesthesia was provided with 1%  Lidocaine. Under real-time ultrasound guidance, a 19 gauge percutaneous entry needle was advanced into the collection. Purulent fluid returned. Amplatz wire advanced easily within the collection, confirmed on CT. Tract dilated to facilitate placement of a 10 French pigtail drain catheter, formed centrally within the collection. 20 mL of purulent aspirate were removed and sent for Gram stain and culture. The patient tolerated the procedure well. COMPLICATIONS: None immediate FINDINGS: The right lower quadrant collection was localized. 10 French drain catheter placed as above. Sample of the aspirate sent for Gram stain and culture. IMPRESSION: 1. Technically successful right lower quadrant appendiceal abscess drain catheter placement. Electronically Signed   By: Corlis Leak M.D.   On: 09/25/2017 08:14    Labs:  CBC: Recent Labs    09/24/17 1348 09/25/17 0459  WBC 13.7* 13.1*  HGB 14.8 14.6  HCT 42.6 42.4  PLT 245 277    COAGS: Recent Labs    09/24/17 1738  INR 1.09    BMP: Recent Labs    09/24/17 1348 09/25/17 0459  NA 137 137  K 3.5 3.8  CL 100* 104  CO2 21* 20*  GLUCOSE 100* 110*  BUN 7 6  CALCIUM 9.2 8.8*  CREATININE 0.76  0.76 0.82  GFRNONAA >60  >60 >60  GFRAA >60  >60 >60    LIVER FUNCTION TESTS: No results for input(s): BILITOT, AST, ALT, ALKPHOS, PROT, ALBUMIN in the last 8760 hours.  Assessment and Plan: Pt with hx periappendiceal abscess, s/p drainage 5/2; temp 98.6, WBC 13.1(13.7), creat nl; drain fluid cx pend; cont current tx/drain irrigation; OOB; monitor labs, check f/u CT within 1 week of drain placement   Electronically Signed: D. Jeananne Rama, PA-C 09/25/2017, 9:33 AM   I spent a total of 15 minutes at the the patient's bedside AND on the patient's hospital floor or unit, greater than 50% of which was counseling/coordinating care for periappendiceal abscess drain    Patient ID: Bobby Owens, male   DOB: 08-10-1995, 22 y.o.   MRN: [id number]

## 2017-09-26 DIAGNOSIS — K567 Ileus, unspecified: Secondary | ICD-10-CM | POA: Diagnosis present

## 2017-09-26 DIAGNOSIS — K3533 Acute appendicitis with perforation and localized peritonitis, with abscess: Secondary | ICD-10-CM | POA: Diagnosis present

## 2017-09-26 DIAGNOSIS — R188 Other ascites: Secondary | ICD-10-CM | POA: Diagnosis present

## 2017-09-26 DIAGNOSIS — L02211 Cutaneous abscess of abdominal wall: Secondary | ICD-10-CM | POA: Diagnosis not present

## 2017-09-26 LAB — BASIC METABOLIC PANEL
ANION GAP: 15 (ref 5–15)
BUN: 8 mg/dL (ref 6–20)
CO2: 18 mmol/L — AB (ref 22–32)
CREATININE: 0.73 mg/dL (ref 0.61–1.24)
Calcium: 8.9 mg/dL (ref 8.9–10.3)
Chloride: 102 mmol/L (ref 101–111)
GFR calc non Af Amer: >60 mL/min (ref >60)
Glucose, Bld: 107 mg/dL — ABNORMAL HIGH (ref 65–99)
Potassium: 3.9 mmol/L (ref 3.5–5.1)
SODIUM: 135 mmol/L (ref 135–145)

## 2017-09-26 LAB — URINALYSIS, ROUTINE W REFLEX MICROSCOPIC
Bilirubin Urine: NEGATIVE
Glucose, UA: NEGATIVE mg/dL
Ketones, ur: 80 mg/dL — AB
Leukocytes, UA: NEGATIVE
Nitrite: NEGATIVE
PROTEIN: 30 mg/dL — AB
SPECIFIC GRAVITY, URINE: 1.027 (ref 1.005–1.030)
pH: 6 (ref 5.0–8.0)

## 2017-09-26 LAB — CBC
HEMATOCRIT: 40.7 % (ref 39.0–52.0)
HEMOGLOBIN: 14.1 g/dL (ref 13.0–17.0)
MCH: 30.2 pg (ref 26.0–34.0)
MCHC: 34.6 g/dL (ref 30.0–36.0)
MCV: 87.2 fL (ref 78.0–100.0)
PLATELETS: 274 10*3/uL (ref 150–400)
RBC: 4.67 MIL/uL (ref 4.22–5.81)
RDW: 12 % (ref 11.5–15.5)
WBC: 11.6 10*3/uL — ABNORMAL HIGH (ref 4.0–10.5)

## 2017-09-26 NOTE — Progress Notes (Addendum)
  Subjective: Patient states he is doing better today. The abdominal pain has improved since yesterday and report only episodic mild abdominal pain in the RLQ around the drain site; no pain most of the time at rest. States at its worse the pain is a 2-3/10. Also endorses continued nausea but is improved from yesterday. Reports flatus and diarrhea. Denies emesis. No trouble with ambulation or PO intake.   Objective: Vital signs in last 24 hours: Temp:  [97.4 F (36.3 C)-98.5 F (36.9 C)] 98.5 F (36.9 C) (05/04 0513) Pulse Rate:  [74-92] 90 (05/04 0513) Resp:  [14-16] 14 (05/04 0513) BP: (125-135)/(75-79) 134/77 (05/04 0513) SpO2:  [89 %-100 %] 100 % (05/04 0513) Last BM Date: 09/23/17  Intake/Output from previous day: 05/03 0701 - 05/04 0700 In: 5073.8 [I.V.:4823.8; IV Piggyback:250] Out: 730 [Urine:700; Drains:30] Intake/Output this shift: No intake/output data recorded.  General appearance: alert, cooperative and no distress Resp: clear to auscultation bilaterally Cardio: regular rate and rhythm GI: abnormal findings:  mild tenderness to palpation of the RLQ Extremities: no signs of DVT Incision/Wound: no signs of infection present, wound is dry and clean.   Lab Results:  Results for orders placed or performed during the hospital encounter of 09/24/17 (from the past 24 hour(s))  CBC     Status: Abnormal   Collection Time: 09/26/17  4:54 AM  Result Value Ref Range   WBC 11.6 (H) 4.0 - 10.5 K/uL   RBC 4.67 4.22 - 5.81 MIL/uL   Hemoglobin 14.1 13.0 - 17.0 g/dL   HCT 91.4 78.2 - 95.6 %   MCV 87.2 78.0 - 100.0 fL   MCH 30.2 26.0 - 34.0 pg   MCHC 34.6 30.0 - 36.0 g/dL   RDW 21.3 08.6 - 57.8 %   Platelets 274 150 - 400 K/uL  Basic metabolic panel     Status: Abnormal   Collection Time: 09/26/17  4:54 AM  Result Value Ref Range   Sodium 135 135 - 145 mmol/L   Potassium 3.9 3.5 - 5.1 mmol/L   Chloride 102 101 - 111 mmol/L   CO2 18 (L) 22 - 32 mmol/L   Glucose, Bld 107 (H)  65 - 99 mg/dL   BUN 8 6 - 20 mg/dL   Creatinine, Ser 4.69 0.61 - 1.24 mg/dL   Calcium 8.9 8.9 - 62.9 mg/dL   GFR calc non Af Amer >60 >60 mL/min   GFR calc Af Amer >60 >60 mL/min   Anion gap 15 5 - 15     Studies/Results Radiology     MEDS, Scheduled . acetaminophen  1,000 mg Oral Q8H   Or  . acetaminophen  650 mg Rectal Q8H  . enoxaparin (LOVENOX) injection  40 mg Subcutaneous Q24H  . ibuprofen  600 mg Oral Q8H  . sodium chloride flush  5 mL Intracatheter Q8H     Assessment: Patient is a well-appearing 22 year old male who was admitted to the hospital with perforated appendicitis and a large associated abscess. He states he is doing better today and is only in mild discomfort.    Plan: -Continue IV zosyn -Continue phenergan PRN for nausea -Encourage continued ambulation - Advance to liquid diet as tolerated   LOS: 0 days    Lum Stillinger PA-S   09/26/2017 7:51 AM

## 2017-09-27 LAB — CBC
HCT: 37.9 % — ABNORMAL LOW (ref 39.0–52.0)
HEMOGLOBIN: 13.2 g/dL (ref 13.0–17.0)
MCH: 30.2 pg (ref 26.0–34.0)
MCHC: 34.8 g/dL (ref 30.0–36.0)
MCV: 86.7 fL (ref 78.0–100.0)
Platelets: 297 10*3/uL (ref 150–400)
RBC: 4.37 MIL/uL (ref 4.22–5.81)
RDW: 11.7 % (ref 11.5–15.5)
WBC: 8.9 10*3/uL (ref 4.0–10.5)

## 2017-09-27 NOTE — Progress Notes (Signed)
  Subjective: Patient states he is doing better today. The abdominal pain continues to improve. He states that he is only having very rare flare ups of mild abdominal pain. He is passing flatus and is still having diarrhea. Reports he is doing well on his current diet. No nausea or vomiting today. No difficulties with ambulation or PO intake.   Objective: Vital signs in last 24 hours: Temp:  [97.6 F (36.4 C)-98.4 F (36.9 C)] 98.4 F (36.9 C) (05/05 0340) Pulse Rate:  [69-94] 92 (05/05 0340) Resp:  [14-15] 14 (05/05 0340) BP: (124-133)/(65-87) 133/79 (05/05 0340) SpO2:  [100 %] 100 % (05/05 0340) Last BM Date: 09/26/17  Intake/Output from previous day: 05/04 0701 - 05/05 0700 In: 448.3 [I.V.:333.3; IV Piggyback:100] Out: 375 [Urine:375] Intake/Output this shift: No intake/output data recorded.  General appearance: alert, cooperative and no distress Resp: clear to auscultation bilaterally Cardio: regular rate and rhythm GI: soft, non-tender; bowel sounds normal; no masses,  no organomegaly Incision/Wound: No signs of infection present  Lab Results:  Results for orders placed or performed during the hospital encounter of 09/24/17 (from the past 24 hour(s))  Urinalysis, Routine w reflex microscopic     Status: Abnormal   Collection Time: 09/26/17  8:42 AM  Result Value Ref Range   Color, Urine YELLOW YELLOW   APPearance HAZY (A) CLEAR   Specific Gravity, Urine 1.027 1.005 - 1.030   pH 6.0 5.0 - 8.0   Glucose, UA NEGATIVE NEGATIVE mg/dL   Hgb urine dipstick SMALL (A) NEGATIVE   Bilirubin Urine NEGATIVE NEGATIVE   Ketones, ur 80 (A) NEGATIVE mg/dL   Protein, ur 30 (A) NEGATIVE mg/dL   Nitrite NEGATIVE NEGATIVE   Leukocytes, UA NEGATIVE NEGATIVE   RBC / HPF 0-5 0 - 5 RBC/hpf   WBC, UA 0-5 0 - 5 WBC/hpf   Bacteria, UA FEW (A) NONE SEEN   Squamous Epithelial / LPF 0-5 0 - 5   Mucus PRESENT   CBC     Status: Abnormal   Collection Time: 09/27/17  4:08 AM  Result Value Ref  Range   WBC 8.9 4.0 - 10.5 K/uL   RBC 4.37 4.22 - 5.81 MIL/uL   Hemoglobin 13.2 13.0 - 17.0 g/dL   HCT 04.5 (L) 40.9 - 81.1 %   MCV 86.7 78.0 - 100.0 fL   MCH 30.2 26.0 - 34.0 pg   MCHC 34.8 30.0 - 36.0 g/dL   RDW 91.4 78.2 - 95.6 %   Platelets 297 150 - 400 K/uL     Studies/Results Radiology     MEDS, Scheduled . acetaminophen  1,000 mg Oral Q8H   Or  . acetaminophen  650 mg Rectal Q8H  . enoxaparin (LOVENOX) injection  40 mg Subcutaneous Q24H  . ibuprofen  600 mg Oral Q8H  . sodium chloride flush  5 mL Intracatheter Q8H     Assessment: Patient is a well-appearing 22 year old male who was admitted to the hospital with perforated appendicitis and a large associated abscess. He is continuing to improve today. Doing well on the current diet. Pain has improved since yesterday. Denies N/V. Still having diarrhea.     Plan: - Continue IV zosyn - Continue pain medication and anti-emetics as needed  - Encourage continued ambulation - Advance diet as tolerated   LOS: 1 day    Abbigail Anstey, PA-S  09/27/2017 8:16 AM

## 2017-09-27 NOTE — Plan of Care (Signed)
Plan of care discussed with patient 

## 2017-09-28 MED ORDER — AMOXICILLIN-POT CLAVULANATE 875-125 MG PO TABS
1.0000 | ORAL_TABLET | Freq: Two times a day (BID) | ORAL | Status: DC
  Filled 2017-09-28: qty 1

## 2017-09-28 MED ORDER — AMOXICILLIN-POT CLAVULANATE 400-57 MG/5ML PO SUSR
875.0000 mg | Freq: Two times a day (BID) | ORAL | Status: DC
  Administered 2017-09-28 – 2017-09-29 (×2): 875 mg via ORAL
  Filled 2017-09-28 (×3): qty 10.9

## 2017-09-28 NOTE — Progress Notes (Signed)
This Clinical research associate in to assess pt. Pt OOB and headed to BR requesting "something to clean my legs with" pt reports that he had "diarrhea"    And needed to clean himself up, declined offers of assistance. Unable to fully assess patient at this time, due to his appearance of needing to return to restroom at this time. No acute distress noted at this time.

## 2017-09-28 NOTE — Progress Notes (Signed)
   Subjective/Chief Complaint: No complaints   Objective: Vital signs in last 24 hours: Temp:  [98.1 F (36.7 C)-98.7 F (37.1 C)] 98.7 F (37.1 C) (05/06 0533) Pulse Rate:  [93-97] 97 (05/06 0533) Resp:  [16-17] 17 (05/06 0533) BP: (126-130)/(76-85) 129/77 (05/06 0533) SpO2:  [98 %-100 %] 98 % (05/06 0533) Last BM Date: 09/26/17  Intake/Output from previous day: 05/05 0701 - 05/06 0700 In: 1161.7 [P.O.:360; I.V.:801.7] Out: -  Intake/Output this shift: No intake/output data recorded.  General appearance: alert and cooperative Resp: clear to auscultation bilaterally Cardio: regular rate and rhythm GI: soft, mild tenderness. drain output serosanguinous  Lab Results:  Recent Labs    09/26/17 0454 09/27/17 0408  WBC 11.6* 8.9  HGB 14.1 13.2  HCT 40.7 37.9*  PLT 274 297   BMET Recent Labs    09/26/17 0454  NA 135  K 3.9  CL 102  CO2 18*  GLUCOSE 107*  BUN 8  CREATININE 0.73  CALCIUM 8.9   PT/INR No results for input(s): LABPROT, INR in the last 72 hours. ABG No results for input(s): PHART, HCO3 in the last 72 hours.  Invalid input(s): PCO2, PO2  Studies/Results: No results found.  Anti-infectives: Anti-infectives (From admission, onward)   Start     Dose/Rate Route Frequency Ordered Stop   09/28/17 1200  amoxicillin-clavulanate (AUGMENTIN) 875-125 MG per tablet 1 tablet     1 tablet Oral Every 12 hours 09/28/17 1151     09/24/17 2200  piperacillin-tazobactam (ZOSYN) IVPB 3.375 g  Status:  Discontinued     3.375 g 12.5 mL/hr over 240 Minutes Intravenous Every 8 hours 09/24/17 1438 09/28/17 1151   09/24/17 1445  piperacillin-tazobactam (ZOSYN) IVPB 3.375 g  Status:  Discontinued     3.375 g 100 mL/hr over 30 Minutes Intravenous Every 8 hours 09/24/17 1431 09/24/17 1437   09/24/17 1345  piperacillin-tazobactam (ZOSYN) IVPB 3.375 g     3.375 g 100 mL/hr over 30 Minutes Intravenous  Once 09/24/17 1338 09/24/17 1418      Assessment/Plan: s/p *  No surgery found * Advance diet. Start soft diet today Switch to augmentin since wbc normal and no fever. ambulate  LOS: 2 days    TOTH III,Sion Thane S 09/28/2017

## 2017-09-28 NOTE — Discharge Instructions (Signed)
Percutaneous Abscess Drain, Care After °This sheet gives you information about how to care for yourself after your procedure. Your health care provider may also give you more specific instructions. If you have problems or questions, contact your health care provider. °What can I expect after the procedure? °After your procedure, it is common to have: °· A small amount of bruising and discomfort in the area where the drainage tube (catheter) was placed. °· Sleepiness and fatigue. This should go away after the medicines you were given have worn off. ° °Follow these instructions at home: °Incision care °· Follow instructions from your health care provider about how to take care of your incision. Make sure you: °? Wash your hands with soap and water before you change your bandage (dressing). If soap and water are not available, use hand sanitizer. °? Change your dressing as told by your health care provider. °? Leave stitches (sutures), skin glue, or adhesive strips in place. These skin closures may need to stay in place for 2 weeks or longer. If adhesive strip edges start to loosen and curl up, you may trim the loose edges. Do not remove adhesive strips completely unless your health care provider tells you to do that. °· Check your incision area every day for signs of infection. Check for: °? More redness, swelling, or pain. °? More fluid or blood. °? Warmth. °? Pus or a bad smell. °? Fluid leaking from around your catheter (instead of fluid draining through your catheter). °Catheter care °· Follow instructions from your health care provider about emptying and cleaning your catheter and collection bag. You may need to clean the catheter every day so it does not clog. °· If directed, write down the following information every time you empty your bag: °? The date and time. °? The amount of drainage. °General instructions °· Rest at home for 1-2 days after your procedure. Return to your normal activities as told by your  health care provider. °· Do not take baths, swim, or use a hot tub for 24 hours after your procedure, or until your health care provider says that this is okay. °· Take over-the-counter and prescription medicines only as told by your health care provider. °· Keep all follow-up visits as told by your health care provider. This is important. °Contact a health care provider if: °· You have less than 10 mL of drainage a day for 2-3 days in a row, or as directed by your health care provider. °· You have more redness, swelling, or pain around your incision area. °· You have more fluid or blood coming from your incision area. °· Your incision area feels warm to the touch. °· You have pus or a bad smell coming from your incision area. °· You have fluid leaking from around your catheter (instead of through your catheter). °· You have a fever or chills. °· You have pain that does not get better with medicine. °Get help right away if: °· Your catheter comes out. °· You suddenly stop having drainage from your catheter. °· You suddenly have blood in the fluid that is draining from your catheter. °· You become dizzy or you faint. °· You develop a rash. °· You have nausea or vomiting. °· You have difficulty breathing or you feel short of breath. °· You develop chest pain. °· You have problems with your speech or vision. °· You have trouble balancing or moving your arms or legs. °Summary °· It is common to have a small   amount of bruising and discomfort in the area where the drainage tube (catheter) was placed.  You may be directed to record the amount of drainage from the bag every time you empty it.  Follow instructions from your health care provider about emptying and cleaning your catheter and collection bag. This information is not intended to replace advice given to you by your health care provider. Make sure you discuss any questions you have with your health care provider. Document Released: 09/26/2013 Document  Revised: 04/03/2016 Document Reviewed: 04/03/2016 Elsevier Interactive Patient Education  2017 [employer]   Appendicitis The appendix is a tube that is shaped like a finger. It is connected to the large intestine. Appendicitis means that this tube is swollen (inflamed). Without treatment, the tube can tear (rupture). This can lead to a life-threatening infection. It can also cause you to have sores (abscesses). These sores hurt. What are the causes? This condition may be caused by something that blocks the appendix, such as:  A ball of poop (stool).  Lymph glands that are bigger than normal.  Sometimes, the cause is not known. What are the signs or symptoms? Symptoms of this condition include:  Pain around the belly button (navel). ? The pain moves toward the lower right belly (abdomen). ? The pain can get worse with time. ? The pain can get worse if you cough. ? The pain can get worse if you move suddenly.  Tenderness in the lower right belly.  Feeling sick to your stomach (nauseous).  Throwing up (vomiting).  Not feeling hungry (loss of appetite).  A fever.  Having a hard time pooping (constipation).  Watery poop (diarrhea).  Not feeling well.  How is this treated? Usually, this condition is treated by taking out the appendix (appendectomy). There are two ways that the appendix can be taken out:  Open surgery. In this surgery, the appendix is taken out through a large cut (incision). The cut is made in the lower right belly. This surgery may be picked if: ? You have scars from another surgery. ? You have a bleeding condition. ? You are pregnant and will be having your baby soon. ? You have a condition that does not allow the other type of surgery.  Laparoscopic surgery. In this surgery, the appendix is taken out through small cuts. Often, this surgery: ? Causes less pain. ? Causes fewer problems. ? Is easier to heal from.  If your appendix tears and a sore  forms:  A drain may be put into the sore. The drain will be used to get rid of fluid.  You may get an antibiotic medicine through an IV tube.  Your appendix may or may not need to be taken out.  This information is not intended to replace advice given to you by your health care provider. Make sure you discuss any questions you have with your health care provider. Document Released: 08/04/2011 Document Revised: 10/18/2015 Document Reviewed: 09/27/2014 Elsevier Interactive Patient Education  [employer].

## 2017-09-28 NOTE — Progress Notes (Signed)
Pt unable to swallow Augmentin tablet. Please prescribe in liquid form.

## 2017-09-28 NOTE — Discharge Summary (Signed)
[facility] Discharge Summary   Patient ID: Bobby Owens MRN: [id number] DOB/AGE: [birth year] 21 y.o.  Admit date: 09/24/2017 Discharge date: 09/29/2017  Admitting Diagnosis: Perforated appendicitis  Discharge Diagnosis Patient Active Problem List   Diagnosis Date Noted  . Perforated appendicitis 09/24/2017    Consultants Interventional radiology  Imaging: No results found.  Procedures Dr. Deanne Coffer (09/24/17) - Percutaneous drainage of abscess  Hospital Course:  Patient is a 22 year old male who presented to [facility] with a 4 day history of abdominal pain.  Workup showed perforated appendicitis.  Patient was admitted and underwent procedure listed above.  Tolerated procedure well and was transferred to the floor.  Diet was advanced as tolerated.  On 09/29/2017, the patient was voiding well, tolerating diet, ambulating well, pain well controlled, vital signs stable, drain with bloody drainage and felt stable for discharge home.  Patient will follow up in our office in 2 weeks and knows to call with questions or concerns.  He will call to confirm appointment date/time.    Physical Exam: General:  Alert, NAD, pleasant, comfortable Abd:  Soft, ND, mild tenderness, incisions C/D/I, drain with minimal bloody fluid  Attempted to look this patient up in controlled substances database, no record found.   Allergies as of 09/29/2017   No Known Allergies     Medication List    TAKE these medications   acetaminophen 500 MG tablet Commonly known as:  TYLENOL Take 2 tablets (1,000 mg total) by mouth every 8 (eight) hours as needed for mild pain.   amoxicillin-clavulanate 400-57 MG/5ML suspension Commonly known as:  AUGMENTIN Take 10.9 mLs (875 mg total) by mouth every 12 (twelve) hours for 14 days.   ibuprofen 600 MG tablet Commonly known as:  ADVIL,MOTRIN Take 1 tablet (600 mg total) by mouth every 8 (eight) hours as needed for mild pain.        Follow-up Information    Andria Meuse, MD. Call.   Specialty:  General Surgery Why:  Call to schedule a follow up appointment to be seen in 2-3 weeks.  Contact information: [address] [place] [address] [phone]           Signed: Mattie Marlin, [facility] Pager [phone]

## 2017-09-28 NOTE — Progress Notes (Signed)
Referring Physician(s): White,C  Supervising Physician: Oley Balm  Patient Status:  [facility] - In-pt  Chief Complaint: Abdominal pain, periappendiceal abscess     Subjective: Patient states that he has been having some loose stools, occasional nausea, mild right lower quadrant discomfort.  He has ambulated and tolerated diet okay.  No vomiting.   Allergies: Patient has no known allergies.  Medications: Prior to Admission medications   Not on File     Vital Signs: BP 140/82 (BP Location: Right Arm)   Pulse 95   Temp 98.3 F (36.8 C) (Oral)   Resp (!) 21   Ht 5' 7.5" (1.715 m)   Wt 115 lb (52.2 kg)   SpO2 100%   BMI 17.75 kg/m   Physical Exam right lower quadrant drain intact, insertion site okay, mildly tender, about 15 cc of blood-tinged fluid in drain bag.  Imaging: Ct Image Guided Drainage By Percutaneous Catheter  Result Date: 09/25/2017 CLINICAL DATA:  Appendiceal abscess EXAM: CT GUIDED DRAINAGE OF APPENDICEAL ABSCESS ANESTHESIA/SEDATION: Intravenous Fentanyl and Versed were administered as conscious sedation during continuous monitoring of the patient's level of consciousness and physiological / cardiorespiratory status by the radiology RN, with a total moderate sedation time of 25 minutes. PROCEDURE: The procedure, risks, benefits, and alternatives were explained to the patient. Questions regarding the procedure were encouraged and answered. The patient understands and consents to the procedure. Select axial scans through the lower abdomen obtained. The collection was localized and an appropriate skin entry site was determined and marked. The operative field was prepped with chlorhexidinein a sterile fashion, and a sterile drape was applied covering the operative field. A sterile gown and sterile gloves were used for the procedure. Local anesthesia was provided with 1% Lidocaine. Under real-time ultrasound guidance, a 19 gauge percutaneous entry needle was  advanced into the collection. Purulent fluid returned. Amplatz wire advanced easily within the collection, confirmed on CT. Tract dilated to facilitate placement of a 10 French pigtail drain catheter, formed centrally within the collection. 20 mL of purulent aspirate were removed and sent for Gram stain and culture. The patient tolerated the procedure well. COMPLICATIONS: None immediate FINDINGS: The right lower quadrant collection was localized. 10 French drain catheter placed as above. Sample of the aspirate sent for Gram stain and culture. IMPRESSION: 1. Technically successful right lower quadrant appendiceal abscess drain catheter placement. Electronically Signed   By: Corlis Leak M.D.   On: 09/25/2017 08:14    Labs:  CBC: Recent Labs    09/24/17 1348 09/25/17 0459 09/26/17 0454 09/27/17 0408  WBC 13.7* 13.1* 11.6* 8.9  HGB 14.8 14.6 14.1 13.2  HCT 42.6 42.4 40.7 37.9*  PLT 245 277 274 297    COAGS: Recent Labs    09/24/17 1738  INR 1.09    BMP: Recent Labs    09/24/17 1348 09/25/17 0459 09/26/17 0454  NA 137 137 135  K 3.5 3.8 3.9  CL 100* 104 102  CO2 21* 20* 18*  GLUCOSE 100* 110* 107*  BUN CALCIUM 9.2 8.8* 8.9  CREATININE 0.76  0.76 0.82 0.73  GFRNONAA >60  >60 >60 >60  GFRAA >60  >60 >60 >60    LIVER FUNCTION TESTS: No results for input(s): BILITOT, AST, ALT, ALKPHOS, PROT, ALBUMIN in the last 8760 hours.  Assessment and Plan: Pt with hx periappendiceal abscess, s/p drainage 5/2; afebrile; last WBC/creat  nl; drain fluid cx with few eikenella corrodens; continue with drain irrigation/output monitoring; antibiotics  per pharmacy; recommend follow-up CT later this week.  May also need drain injection prior to removal.     Electronically Signed: D. Jeananne Rama, PA-C 09/28/2017, 2:24 PM   I spent a total of 15 minutes at the the patient's bedside AND on the patient's hospital floor or unit, greater than 50% of which was counseling/coordinating care  for periappendiceal abscess drain    Patient ID: Bobby Owens, male   DOB: 10-05-1995, 22 y.o.   MRN: [id number]

## 2017-09-29 ENCOUNTER — Encounter (HOSPITAL_COMMUNITY): Payer: Self-pay | Admitting: Radiology

## 2017-09-29 ENCOUNTER — Inpatient Hospital Stay (HOSPITAL_COMMUNITY): Payer: Commercial Managed Care - PPO

## 2017-09-29 LAB — CBC
HCT: 38.3 % — ABNORMAL LOW (ref 39.0–52.0)
Hemoglobin: 12.8 g/dL — ABNORMAL LOW (ref 13.0–17.0)
MCH: 29 pg (ref 26.0–34.0)
MCHC: 33.4 g/dL (ref 30.0–36.0)
MCV: 86.7 fL (ref 78.0–100.0)
PLATELETS: 340 10*3/uL (ref 150–400)
RBC: 4.42 MIL/uL (ref 4.22–5.81)
RDW: 11.8 % (ref 11.5–15.5)
WBC: 6 10*3/uL (ref 4.0–10.5)

## 2017-09-29 MED ORDER — IBUPROFEN 600 MG PO TABS: 600.0000 mg | ORAL_TABLET | Freq: Three times a day (TID) | ORAL | 0 refills | Status: DC | PRN

## 2017-09-29 MED ORDER — ACETAMINOPHEN 500 MG PO TABS
1000.0000 mg | ORAL_TABLET | Freq: Three times a day (TID) | ORAL | 0 refills | Status: DC | PRN
Start: 2017-09-29 — End: 2017-11-04

## 2017-09-29 MED ORDER — IOPAMIDOL (ISOVUE-300) INJECTION 61%: 15.0000 mL | Freq: Two times a day (BID) | INTRAVENOUS | Status: DC | PRN

## 2017-09-29 MED ORDER — AMOXICILLIN-POT CLAVULANATE 400-57 MG/5ML PO SUSR: 875.0000 mg | Freq: Two times a day (BID) | ORAL | 0 refills | Status: AC

## 2017-09-29 MED ORDER — IOHEXOL 300 MG/ML  SOLN
100.0000 mL | Freq: Once | INTRAMUSCULAR | Status: AC | PRN
  Administered 2017-09-29: 80 mL via INTRAVENOUS

## 2017-09-29 NOTE — Plan of Care (Signed)
Pt alert and oriented, resting with complaints of pain, but denies pain meds.  Repositioned, minimal drainage coming from drain. RN will monitor.

## 2017-09-29 NOTE — Plan of Care (Signed)
Pt alert and oriented, minimal complaints of pain. Pt refuses scheduled tylenol and ibuprofen.  Scant drainage in drain. Flushed per order.

## 2017-09-29 NOTE — Progress Notes (Signed)
Referring Physician(s): Esmond Harps  Supervising Physician: Gilmer Mor  Patient Status:  [facility] - In-pt  Chief Complaint: Periappendiceal abscess s/p drain placement 09/24/2017  Subjective:  Patient awake and alert laying in bed with no complaints at this time. Accompanied by father at bedside. Denies abdominal pain, N/V, or fever. RLQ drain incision c/d/i.  Allergies: Patient has no known allergies.  Medications: Prior to Admission medications   Medication Sig Start Date End Date Taking? Authorizing Provider  acetaminophen (TYLENOL) 500 MG tablet Take 2 tablets (1,000 mg total) by mouth every 8 (eight) hours as needed for mild pain. 09/29/17   Rayburn, Tresa Endo A, PA-C  amoxicillin-clavulanate (AUGMENTIN) 400-57 MG/5ML suspension Take 10.9 mLs (875 mg total) by mouth every 12 (twelve) hours for 14 days. 09/29/17 10/13/17  Rayburn, Alphonsus Sias, PA-C  ibuprofen (ADVIL,MOTRIN) 600 MG tablet Take 1 tablet (600 mg total) by mouth every 8 (eight) hours as needed for mild pain. 09/29/17   Rayburn, Alphonsus Sias, PA-C     Vital Signs: BP (!) 152/83 (BP Location: Left Arm)   Pulse (!) 119   Temp 98.3 F (36.8 C) (Oral)   Resp 18   Ht 5' 7.5" (1.715 m)   Wt 115 lb (52.2 kg)   SpO2 100%   BMI 17.75 kg/m   Physical Exam  Constitutional: He is oriented to person, place, and time. He appears well-developed and well-nourished. No distress.  Neurological: He is alert and oriented to person, place, and time.  Skin: Skin is warm and dry.  RUQ drain incision c/d/i with gravity bag attached, no active bleeding, minimal blood tinged fluid in gravity bag.  Psychiatric: He has a normal mood and affect. His behavior is normal. Judgment and thought content normal.    Imaging: Ct Abdomen Pelvis W Contrast  Result Date: 09/29/2017 CLINICAL DATA:  22 year old male with a history of ruptured appendicitis and prior drainage EXAM: CT ABDOMEN AND PELVIS WITH CONTRAST TECHNIQUE: Multidetector CT imaging of the abdomen  and pelvis was performed using the standard protocol following bolus administration of intravenous contrast. CONTRAST:  80mL OMNIPAQUE IOHEXOL 300 MG/ML  SOLN COMPARISON:  09/24/2017 FINDINGS: Lower chest: No acute abnormality. Hepatobiliary: Unremarkable appearance of liver. Unremarkable gallbladder which is decompressed. Pancreas: Unremarkable pancreas Spleen: Unremarkable spleen Adrenals/Urinary Tract: Unremarkable appearance of the adrenal glands. No evidence of hydronephrosis of the right or left kidney. No nephrolithiasis. Unremarkable course of the bilateral ureters. Unremarkable appearance of the urinary bladder. Stomach/Bowel: Unremarkable appearance of stomach. Unremarkable small bowel. Enteric contrast reaches the colon extends to the rectum. No evidence of obstruction. Compared to the prior imaging there is significant reduction in the fluid collection of the right lower quadrant adjacent to the cecum, with unchanged position of the pigtail drainage catheter. Hyperdense focus medial to the drain measures 5 mm, compatible with appendicolith. There is a small rim enhancing residual fluid collection just inferior to the drain measuring 12 mm. Lymph nodes within the mesentery. Persisting haziness and inflammatory changes of the adjacent fat. No free air. Vascular/Lymphatic: Unremarkable appearance of the vasculature. No periaortic or preaortic lymph nodes. Small lymph nodes of the mesentery of the right lower quadrant. Reproductive: Unremarkable appearance the pelvic structures Other: None Musculoskeletal: No acute displaced fracture. IMPRESSION: Unchanged position of pigtail drainage catheter status post drainage of periappendiceal abscess. There is persisting mild inflammatory changes with significantly decreased abscess collection, with only a small residual fluid collection measuring 12 mm. Appendicolith is present medial to the pigtail drainage catheter. Electronically Signed  By: Gilmer Mor D.O.    On: 09/29/2017 15:32    Labs:  CBC: Recent Labs    09/25/17 0459 09/26/17 0454 09/27/17 0408 09/29/17 0442  WBC 13.1* 11.6* 8.9 6.0  HGB 14.6 14.1 13.2 12.8*  HCT 42.4 40.7 37.9* 38.3*  PLT 277 274 297 340    COAGS: Recent Labs    09/24/17 1738  INR 1.09    BMP: Recent Labs    09/24/17 1348 09/25/17 0459 09/26/17 0454  NA 137 137 135  K 3.5 3.8 3.9  CL 100* 104 102  CO2 21* 20* 18*  GLUCOSE 100* 110* 107*  BUN CALCIUM 9.2 8.8* 8.9  CREATININE 0.76  0.76 0.82 0.73  GFRNONAA >60  >60 >60 >60  GFRAA >60  >60 >60 >60    LIVER FUNCTION TESTS: No results for input(s): BILITOT, AST, ALT, ALKPHOS, PROT, ALBUMIN in the last 8760 hours.  Assessment and Plan:  Periappendiceal abscess s/p drain placement 09/24/2017.  CT abdomen/pelvis today: 1. Unchanged position of pigtail drainage catheter status post drainage of periappendiceal abscess. There is persisting mild inflammatory changes with significantly decreased abscess collection, with only a small residual fluid collection measuring 12 mm. 2. Appendicolith is present medial to the pigtail drainage catheter.  Reviewed with Dr. Loreta Ave. Drain to stay in place- surgery team notified. Patient likely to discharge home with drain today. Nurse to assist with education- will place outpatient orders. Patient will hear from schedulers with date and time of appointment in IR drain clinic.  Electronically Signed: Elwin Mocha, PA-C 09/29/2017, 3:41 PM   I spent a total of 15 Minutes at the the patient's bedside AND on the patient's hospital floor or unit, greater than 50% of which was counseling/coordinating care for periappendiceal abscess s/p drain placement.

## 2017-09-29 NOTE — Progress Notes (Signed)
[facility] Progress Note     Subjective: CC: perforated appendicitis Pain reasonably well controlled. Diarrhea decreasing. Tlerating diet.   Objective: Vital signs in last 24 hours: Temp:  [98.3 F (36.8 C)-98.7 F (37.1 C)] 98.7 F (37.1 C) (05/06 1651) Pulse Rate:  [95-96] 96 (05/06 1651) Resp:  [18-21] 18 (05/06 1651) BP: (133-140)/(71-82) 133/71 (05/06 1651) SpO2:  [99 %-100 %] 99 % (05/06 1651) Last BM Date: 09/29/17  Intake/Output from previous day: 05/06 0701 - 05/07 0700 In: 1150 [P.O.:200; I.V.:940] Out: 11 [Drains:11] Intake/Output this shift: No intake/output data recorded.  PE: Gen:  Alert, NAD, pleasant Card:  Regular rate and rhythm, pedal pulses 2+ BL Pulm:  Normal effort, clear to auscultation bilaterally Abd: Soft, mildly tender RLQ, non-distended, bowel sounds present, no HSM, drain with purulent/bloody drainage Skin: warm and dry, no rashes  Psych: A&Ox3   Lab Results:  Recent Labs    09/27/17 0408 09/29/17 0442  WBC 8.9 6.0  HGB 13.2 12.8*  HCT 37.9* 38.3*  PLT 297 340   BMET No results for input(s): NA, K, CL, CO2, GLUCOSE, BUN, CREATININE, CALCIUM in the last 72 hours. PT/INR No results for input(s): LABPROT, INR in the last 72 hours. CMP     Component Value Date/Time   NA 135 09/26/2017 0454   K 3.9 09/26/2017 0454   CL 102 09/26/2017 0454   CO2 18 (L) 09/26/2017 0454   GLUCOSE 107 (H) 09/26/2017 0454   BUN 8 09/26/2017 0454   CREATININE 0.73 09/26/2017 0454   CALCIUM 8.9 09/26/2017 0454   GFRNONAA >60 09/26/2017 0454   GFRAA >60 09/26/2017 0454   Lipase  No results found for: LIPASE     Studies/Results: No results found.  Anti-infectives: Anti-infectives (From admission, onward)   Start     Dose/Rate Route Frequency Ordered Stop   09/29/17 0000  amoxicillin-clavulanate (AUGMENTIN) 400-57 MG/5ML suspension     875 mg Oral Every 12 hours 09/29/17 0854 10/13/17 2359   09/28/17 1830  amoxicillin-clavulanate  (AUGMENTIN) 400-57 MG/5ML suspension 875 mg     875 mg Oral Every 12 hours 09/28/17 1804     09/28/17 1800  amoxicillin-clavulanate (AUGMENTIN) 875-125 MG per tablet 1 tablet  Status:  Discontinued     1 tablet Oral Every 12 hours 09/28/17 1151 09/28/17 1804   09/24/17 2200  piperacillin-tazobactam (ZOSYN) IVPB 3.375 g  Status:  Discontinued     3.375 g 12.5 mL/hr over 240 Minutes Intravenous Every 8 hours 09/24/17 1438 09/28/17 1151   09/24/17 1445  piperacillin-tazobactam (ZOSYN) IVPB 3.375 g  Status:  Discontinued     3.375 g 100 mL/hr over 30 Minutes Intravenous Every 8 hours 09/24/17 1431 09/24/17 1437   09/24/17 1345  piperacillin-tazobactam (ZOSYN) IVPB 3.375 g     3.375 g 100 mL/hr over 30 Minutes Intravenous  Once 09/24/17 1338 09/24/17 1418       Assessment/Plan Perforated appendicitis - s/p IR drain placement 09/25/17 - Radiology recommended repeat CT sometime this week, ordered for today - patient tolerating a diet - WBC not increasing on PO abx - pain improving - if CT ok, d/c later today   FEN: soft diet VTE: lovenox ID: Zosyn 5/2>5/6; Augmentin 5/6>>  LOS: 3 days    Wells Guiles , [facility] 09/29/2017, 9:03 AM Pager: [phone] Consults: [phone] Mon-Fri 7:00 am-4:30 pm Sat-Sun 7:00 am-11:30 am

## 2017-09-29 NOTE — Progress Notes (Signed)
Discharge paperwork give to pt and father.  Questions answered and drain teaching was performed. Pt and father both understand how to flush and care from drain at home, including log and follow-up appts.  Pt to be escorted to main lobby for d/c.

## 2017-09-30 ENCOUNTER — Other Ambulatory Visit: Payer: Self-pay | Admitting: Surgery

## 2017-09-30 DIAGNOSIS — K3532 Acute appendicitis with perforation and localized peritonitis, without abscess: Secondary | ICD-10-CM

## 2017-09-30 LAB — AEROBIC/ANAEROBIC CULTURE (SURGICAL/DEEP WOUND)

## 2017-09-30 LAB — AEROBIC/ANAEROBIC CULTURE W GRAM STAIN (SURGICAL/DEEP WOUND)

## 2017-10-05 MED FILL — NORMAL SALINE FLUSH SYRINGE: 0.9 | 1 days supply | Qty: 50 | Fill #0

## 2017-10-07 ENCOUNTER — Ambulatory Visit
Admission: RE | Admit: 2017-10-07 | Discharge: 2017-10-07 | Disposition: A | Discharge status: Home / Self Care | Payer: Commercial Managed Care - PPO | Source: Ambulatory Visit | Attending: Surgery | Admitting: Surgery

## 2017-10-07 ENCOUNTER — Ambulatory Visit
Admission: RE | Admit: 2017-10-07 | Discharge: 2017-10-07 | Disposition: A | Discharge status: Home / Self Care | Payer: Commercial Managed Care - PPO | Source: Ambulatory Visit | Attending: Student | Admitting: Student

## 2017-10-07 DIAGNOSIS — K3532 Acute appendicitis with perforation and localized peritonitis, without abscess: Secondary | ICD-10-CM | POA: Diagnosis not present

## 2017-10-07 HISTORY — PX: IR RADIOLOGIST EVAL & MGMT: IMG5224

## 2017-10-07 MED ORDER — IOPAMIDOL (ISOVUE-300) INJECTION 61%
100.0000 mL | Freq: Once | INTRAVENOUS | Status: AC | PRN
  Administered 2017-10-07: 100 mL via INTRAVENOUS

## 2017-10-07 NOTE — Progress Notes (Signed)
Chief Complaint: Ruptured appendicitis  Referring Physician(s): Dr. Cliffton Asters  History of Present Illness: Bobby Owens is a 22 y.o. male presenting today as a scheduled appointment after a percutaneous drain was placed for ruptured appendicitis.    He was admitted to [facility] 09/24/2017 with ruptured appy, and had a CT drain placed 09/24/2017.    Dr. Cliffton Asters was the consulting surgeon. He was discharged 09/28/2017 having a follow up CT showing small residual fluid collection and a small appendicolith adjacent to the drain.    Today he is here with his father.  He tells me he has been flushing with ~5cc every 8 hours.  He has not emptied the bag since his discharge, and that he has about 15cc in the bag.  This would be over about 10 days.  He has not had any fevers.  He reports occasional discomfort in his inguinal region when voiding, both with BM's and urine.    CT today shows no residual abscess.  Injection was deferred for radiation reduction. Persisting appendicolith.   Past Medical History:  Diagnosis Date  . Medical history non-contributory     Past Surgical History:  Procedure Laterality Date  . NO PAST SURGERIES      Allergies: Patient has no known allergies.  Medications: Prior to Admission medications   Medication Sig Start Date End Date Taking? Authorizing Provider  acetaminophen (TYLENOL) 500 MG tablet Take 2 tablets (1,000 mg total) by mouth every 8 (eight) hours as needed for mild pain. 09/29/17   Rayburn, Tresa Endo A, PA-C  amoxicillin-clavulanate (AUGMENTIN) 400-57 MG/5ML suspension Take 10.9 mLs (875 mg total) by mouth every 12 (twelve) hours for 14 days. 09/29/17 10/13/17  Rayburn, Alphonsus Sias, PA-C  ibuprofen (ADVIL,MOTRIN) 600 MG tablet Take 1 tablet (600 mg total) by mouth every 8 (eight) hours as needed for mild pain. 09/29/17   Rayburn, Alphonsus Sias, PA-C     No family history on file.  Social History   Socioeconomic History  . Marital status: Single    Spouse name: Not on file    . Number of children: Not on file  . Years of education: Not on file  . Highest education level: Not on file  Occupational History  . Not on file  Social Needs  . Financial resource strain: Not on file  . Food insecurity:    Worry: Not on file    Inability: Not on file  . Transportation needs:    Medical: Not on file    Non-medical: Not on file  Tobacco Use  . Smoking status: Never Smoker  . Smokeless tobacco: Never Used  Substance and Sexual Activity  . Alcohol use: Never    Frequency: Never  . Drug use: Never  . Sexual activity: Not on file  Lifestyle  . Physical activity:    Days per week: Not on file    Minutes per session: Not on file  . Stress: Not on file  Relationships  . Social connections:    Talks on phone: Not on file    Gets together: Not on file    Attends religious service: Not on file    Active member of club or organization: Not on file    Attends meetings of clubs or organizations: Not on file    Relationship status: Not on file  Other Topics Concern  . Not on file  Social History Narrative  . Not on file    Review of Systems: A 12 point ROS discussed and  pertinent positives are indicated in the HPI above.  All other systems are negative.  Review of Systems  Vital Signs: BP 111/60   Pulse 80   Temp 97.9 F (36.6 C) (Oral)   Resp 14   Ht  (1.727 m)   Wt 115 lb (52.2 kg)   SpO2 100%   BMI 17.49 kg/m   Physical Exam Targeted exam of the skin site shows no sign of infection.  No drainage at the skin.  No erythema.  The retention suture and the stat lock are unchanged.   Imaging: Ct Abdomen Pelvis W Contrast  Result Date: 09/29/2017 CLINICAL DATA:  22 year old male with a history of ruptured appendicitis and prior drainage EXAM: CT ABDOMEN AND PELVIS WITH CONTRAST TECHNIQUE: Multidetector CT imaging of the abdomen and pelvis was performed using the standard protocol following bolus administration of intravenous contrast. CONTRAST:  80mL  OMNIPAQUE IOHEXOL 300 MG/ML  SOLN COMPARISON:  09/24/2017 FINDINGS: Lower chest: No acute abnormality. Hepatobiliary: Unremarkable appearance of liver. Unremarkable gallbladder which is decompressed. Pancreas: Unremarkable pancreas Spleen: Unremarkable spleen Adrenals/Urinary Tract: Unremarkable appearance of the adrenal glands. No evidence of hydronephrosis of the right or left kidney. No nephrolithiasis. Unremarkable course of the bilateral ureters. Unremarkable appearance of the urinary bladder. Stomach/Bowel: Unremarkable appearance of stomach. Unremarkable small bowel. Enteric contrast reaches the colon extends to the rectum. No evidence of obstruction. Compared to the prior imaging there is significant reduction in the fluid collection of the right lower quadrant adjacent to the cecum, with unchanged position of the pigtail drainage catheter. Hyperdense focus medial to the drain measures 5 mm, compatible with appendicolith. There is a small rim enhancing residual fluid collection just inferior to the drain measuring 12 mm. Lymph nodes within the mesentery. Persisting haziness and inflammatory changes of the adjacent fat. No free air. Vascular/Lymphatic: Unremarkable appearance of the vasculature. No periaortic or preaortic lymph nodes. Small lymph nodes of the mesentery of the right lower quadrant. Reproductive: Unremarkable appearance the pelvic structures Other: None Musculoskeletal: No acute displaced fracture. IMPRESSION: Unchanged position of pigtail drainage catheter status post drainage of periappendiceal abscess. There is persisting mild inflammatory changes with significantly decreased abscess collection, with only a small residual fluid collection measuring 12 mm. Appendicolith is present medial to the pigtail drainage catheter. Electronically Signed   By: Gilmer Mor D.O.   On: 09/29/2017 15:32   Ct Abdomen Pelvis W Contrast  Result Date: 09/24/2017 CLINICAL DATA:  Right lower quadrant pain  EXAM: CT ABDOMEN AND PELVIS WITH CONTRAST TECHNIQUE: Multidetector CT imaging of the abdomen and pelvis was performed using the standard protocol following bolus administration of intravenous contrast. CONTRAST:  100 mL Isovue 370 nonionic COMPARISON:  None. FINDINGS: Lower chest: Lung bases are clear. Hepatobiliary: No focal liver lesions are apparent. Gallbladder wall is not appreciably thickened. There is no biliary duct dilatation. Pancreas: No pancreatic mass or inflammatory focus. Spleen: No splenic lesions are evident. Adrenals/Urinary Tract: Adrenals bilaterally appear normal. Kidneys bilaterally show no evident mass or hydronephrosis on either side. There is no renal or ureteral calculus on either side. Urinary bladder is midline with wall thickness within normal limits. Stomach/Bowel: There is medial proximal colonic wall thickening due to adjacent appendiceal abscess. There is no other appreciable bowel wall thickening. No bowel obstruction evident. No free air or portal venous air. Vascular/Lymphatic: No abdominal aortic aneurysm. No vascular lesions are evident. There is no adenopathy in the abdomen or pelvis. Reproductive: Prostate and seminal vesicles appear normal in size  and contour. No pelvic mass. Other: There is a focal abscess in the right lower quadrant at the site of the appendix measuring 5.7 x 4.3 x 4.1 cm. No other abscess seen elsewhere. There is mild ascites in the dependent portion of the pelvis. Musculoskeletal: No blastic or lytic bone lesions are evident. No intramuscular or abdominal wall lesion evident. IMPRESSION: 1. Periappendiceal abscess measuring 5.7 x 4.3 x 4.1 cm. No other abscess elsewhere. 2.  Mild ascites in the dependent portion of the pelvis. 3.  No bowel obstruction.  No free air. Critical Value/emergent results were called by telephone at the time of interpretation on 09/24/2017 at 12:42 pm to Dr. Joycelyn Rua , who verbally acknowledged these results. Electronically  Signed   By: Bretta Bang III M.D.   On: 09/24/2017 12:42   Ct Image Guided Drainage By Percutaneous Catheter  Result Date: 09/25/2017 CLINICAL DATA:  Appendiceal abscess EXAM: CT GUIDED DRAINAGE OF APPENDICEAL ABSCESS ANESTHESIA/SEDATION: Intravenous Fentanyl and Versed were administered as conscious sedation during continuous monitoring of the patient's level of consciousness and physiological / cardiorespiratory status by the radiology RN, with a total moderate sedation time of 25 minutes. PROCEDURE: The procedure, risks, benefits, and alternatives were explained to the patient. Questions regarding the procedure were encouraged and answered. The patient understands and consents to the procedure. Select axial scans through the lower abdomen obtained. The collection was localized and an appropriate skin entry site was determined and marked. The operative field was prepped with chlorhexidinein a sterile fashion, and a sterile drape was applied covering the operative field. A sterile gown and sterile gloves were used for the procedure. Local anesthesia was provided with 1% Lidocaine. Under real-time ultrasound guidance, a 19 gauge percutaneous entry needle was advanced into the collection. Purulent fluid returned. Amplatz wire advanced easily within the collection, confirmed on CT. Tract dilated to facilitate placement of a 10 French pigtail drain catheter, formed centrally within the collection. 20 mL of purulent aspirate were removed and sent for Gram stain and culture. The patient tolerated the procedure well. COMPLICATIONS: None immediate FINDINGS: The right lower quadrant collection was localized. 10 French drain catheter placed as above. Sample of the aspirate sent for Gram stain and culture. IMPRESSION: 1. Technically successful right lower quadrant appendiceal abscess drain catheter placement. Electronically Signed   By: Corlis Leak M.D.   On: 09/25/2017 08:14    Labs:  CBC: Recent Labs     09/25/17 0459 09/26/17 0454 09/27/17 0408 09/29/17 0442  WBC 13.1* 11.6* 8.9 6.0  HGB 14.6 14.1 13.2 12.8*  HCT 42.4 40.7 37.9* 38.3*  PLT 277 274 297 340    COAGS: Recent Labs    09/24/17 1738  INR 1.09    BMP: Recent Labs    09/24/17 1348 09/25/17 0459 09/26/17 0454  NA 137 137 135  K 3.5 3.8 3.9  CL 100* 104 102  CO2 21* 20* 18*  GLUCOSE 100* 110* 107*  BUN CALCIUM 9.2 8.8* 8.9  CREATININE 0.76  0.76 0.82 0.73  GFRNONAA >60  >60 >60 >60  GFRAA >60  >60 >60 >60    LIVER FUNCTION TESTS: No results for input(s): BILITOT, AST, ALT, ALKPHOS, PROT, ALBUMIN in the last 8760 hours.  TUMOR MARKERS: No results for input(s): AFPTM, CEA, CA199, CHROMGRNA in the last 8760 hours.  Assessment and Plan:  Mr Careaga is a 22 year old male, SP drainage of abscess secondary to ruptured appy 09/24/2017.    CT  today shows no residual abscess, but retained appendicolith.  His surgeon was unavailable immediately to discuss removal, and he has upcoming appointment on 10/20/2017.    My impression is that it is too early to remove given the appendicolith and risk of recurrent abscess.  I discussed that with Mr Katt and his father, who understand.   I recommend decreasing flushes to every other day, which will decrease his out of pocket expense and should be adequate to keep the drain flushed and patent.    Plan: - We have asked him to observe his forth coming surgery appointment with Dr. Cliffton Asters.  - [facility] can always assist with drain removal before or after his appointment if need be.   Electronically Signed: Gilmer Mor 10/07/2017, 10:44 AM   I spent a total of    15 Minutes in face to face in clinical consultation, greater than 50% of which was counseling/coordinating care for abscess drain of RLQ, ruptured appy, retained fecolith/appendicolith

## 2017-10-20 DIAGNOSIS — K3532 Acute appendicitis with perforation and localized peritonitis, without abscess: Secondary | ICD-10-CM | POA: Diagnosis not present

## 2017-10-21 ENCOUNTER — Encounter: Payer: Self-pay | Admitting: Gastroenterology

## 2017-11-04 ENCOUNTER — Ambulatory Visit: Payer: Commercial Managed Care - PPO | Admitting: Gastroenterology

## 2017-11-04 ENCOUNTER — Encounter: Payer: Self-pay | Admitting: Gastroenterology

## 2017-11-04 VITALS — BP 102/66 | HR 72 | Ht 65.75 in | Wt 112.1 lb

## 2017-11-04 DIAGNOSIS — K3532 Acute appendicitis with perforation and localized peritonitis, without abscess: Secondary | ICD-10-CM | POA: Diagnosis not present

## 2017-11-04 DIAGNOSIS — R1031 Right lower quadrant pain: Secondary | ICD-10-CM

## 2017-11-04 MED ORDER — NA SULFATE-K SULFATE-MG SULF 17.5-3.13-1.6 GM/177ML PO SOLN: 1.0000 | Freq: Once | ORAL | 0 refills | Status: AC

## 2017-11-04 NOTE — Patient Instructions (Signed)
You have been scheduled for a colonoscopy. Please follow written instructions given to you at your visit today.  Please pick up your prep supplies at the pharmacy within the next 1-3 days. If you use inhalers (even only as needed), please bring them with you on the day of your procedure. Your physician has requested that you go to www.startemmi.com and enter the access code given to you at your visit today. This web site gives a general overview about your procedure. However, you should still follow specific instructions given to you by our office regarding your preparation for the procedure.  If you are age 22 or older, your body mass index should be between 23-30. Your Body mass index is 18.24 kg/m. If this is out of the aforementioned range listed, please consider follow up with your Primary Care Provider.  If you are age 22 or younger, your body mass index should be between 19-25. Your Body mass index is 18.24 kg/m. If this is out of the aformentioned range listed, please consider follow up with your Primary Care Provider.

## 2017-11-09 NOTE — Progress Notes (Signed)
Bobby Owens    [id number]    1996/05/11  Primary Care Physician:Meyers, Annie Main, MD  Referring Physician: Ileana Roup, MD [address] [place], [place] [address]  Chief complaint: History of perforated appendicitis  HPI:  22 year old male here for new patient visit accompanied by his father.  He was recently hospitalized last month on Sep 24, 2017 for perforated appendicitis had percutaneous drainage tube placed by IR which has been removed since.  CT abdomen and pelvis Oct 07, 2017 showed small retained appendicolith.  He saw Dr. Dema Severin at [facility] surgery and was discussed for him to undergo appendectomy, given history of perforated appendicitis he may potentially have significant adhesions and could result in ileocectomy.  Father is on the fence about doing an extensive surgery but at the same time is hesitant to leave it alone. Patient has mild right lower quadrant abdominal pain otherwise denies any nausea, vomiting, loss of appetite or weight loss.  Bowel habits back to normal and no melena or blood per rectum. On review of system complain of burning sensation when he tries to urinate, denies having a Foley catheter during hospitalization.  No blood in urine.   Outpatient Encounter Medications as of 11/04/2017  Medication Sig  . [EXPIRED] Na Sulfate-K Sulfate-Mg Sulf (SUPREP BOWEL PREP KIT) 17.5-3.13-1.6 GM/177ML SOLN Take 1 kit by mouth once for 1 dose.  . [DISCONTINUED] acetaminophen (TYLENOL) 500 MG tablet Take 2 tablets (1,000 mg total) by mouth every 8 (eight) hours as needed for mild pain.  . [DISCONTINUED] ibuprofen (ADVIL,MOTRIN) 600 MG tablet Take 1 tablet (600 mg total) by mouth every 8 (eight) hours as needed for mild pain.   No facility-administered encounter medications on file as of 11/04/2017.     Allergies as of 11/04/2017  . (No Known Allergies)    Past Medical History:  Diagnosis Date  . Appendicitis with perforation     Past  Surgical History:  Procedure Laterality Date  . IR RADIOLOGIST EVAL & MGMT  10/07/2017    Family History  Problem Relation Age of Onset  . Hypertension Father   . Heart attack Maternal Grandfather   . Stroke Paternal Grandmother     Social History   Socioeconomic History  . Marital status: Single    Spouse name: Not on file  . Number of children: 0  . Years of education: Not on file  . Highest education level: Not on file  Occupational History  . Occupation: Ship broker  Social Needs  . Financial resource strain: Not on file  . Food insecurity:    Worry: Not on file    Inability: Not on file  . Transportation needs:    Medical: Not on file    Non-medical: Not on file  Tobacco Use  . Smoking status: Never Smoker  . Smokeless tobacco: Never Used  Substance and Sexual Activity  . Alcohol use: Never    Frequency: Never  . Drug use: Never  . Sexual activity: Not on file  Lifestyle  . Physical activity:    Days per week: Not on file    Minutes per session: Not on file  . Stress: Not on file  Relationships  . Social connections:    Talks on phone: Not on file    Gets together: Not on file    Attends religious service: Not on file    Active member of club or organization: Not on file    Attends  meetings of clubs or organizations: Not on file    Relationship status: Not on file  . Intimate partner violence:    Fear of current or ex partner: Not on file    Emotionally abused: Not on file    Physically abused: Not on file    Forced sexual activity: Not on file  Other Topics Concern  . Not on file  Social History Narrative  . Not on file      Review of systems: Review of Systems  Constitutional: Negative for fever and chills.  HENT: Negative.   Eyes: Negative for blurred vision.  Respiratory: Negative for cough, shortness of breath and wheezing.   Cardiovascular: Negative for chest pain and palpitations.  Gastrointestinal: as per HPI Genitourinary: Negative for  dysuria, urgency, frequency and hematuria.  Musculoskeletal: Negative for myalgias, back pain and joint pain.  Skin: Negative for itching and rash.  Neurological: Negative for dizziness, tremors, focal weakness, seizures and loss of consciousness.  Endo/Heme/Allergies: Positive for seasonal allergies.  Psychiatric/Behavioral: Negative for depression, suicidal ideas and hallucinations.  All other systems reviewed and are negative.   Physical Exam: Vitals:   11/04/17 0915  BP: 102/66  Pulse: 72   Body mass index is 18.24 kg/m. Gen:      No acute distress HEENT:  EOMI, sclera anicteric Neck:     No masses; no thyromegaly Lungs:    Clear to auscultation bilaterally; normal respiratory effort CV:         Regular rate and rhythm; no murmurs Abd:      + bowel sounds; soft, non-tender; no palpable masses, no distension Ext:    No edema; adequate peripheral perfusion Skin:      Warm and dry; no rash Neuro: alert and oriented x 3 Psych: normal mood and affect  Data Reviewed:  Reviewed labs, radiology imaging, old records and pertinent past GI work up   Assessment and Plan/Recommendations:  22 year old male with perforated appendicitis status post percutaneous drainage with pigtail catheter by IR, evidence of appendicolith on CT abdomen pelvis Will schedule for colonoscopy in 6 to 8 weeks to evaluate and exclude any neoplastic lesion Patient will follow-up with [facility] after colonoscopy  The risks and benefits as well as alternatives of endoscopic procedure(s) have been discussed and reviewed. All questions answered. The patient agrees to proceed.  Damaris Hippo , MD [phone]    CC: Ileana Roup, MD

## 2017-11-12 ENCOUNTER — Encounter: Payer: Self-pay | Admitting: Gastroenterology

## 2017-12-11 ENCOUNTER — Telehealth: Payer: Self-pay | Admitting: Gastroenterology

## 2017-12-11 MED ORDER — NA SULFATE-K SULFATE-MG SULF 17.5-3.13-1.6 GM/177ML PO SOLN: 1.0000 | Freq: Once | ORAL | 0 refills | Status: AC

## 2017-12-11 NOTE — Telephone Encounter (Signed)
Suprep was originally sent in on 6/12  Resent in again today

## 2017-12-11 NOTE — Telephone Encounter (Signed)
Will pick up prep on Monday

## 2017-12-16 ENCOUNTER — Ambulatory Visit (AMBULATORY_SURGERY_CENTER): Payer: Commercial Managed Care - PPO | Admitting: Gastroenterology

## 2017-12-16 ENCOUNTER — Encounter: Payer: Self-pay | Admitting: Gastroenterology

## 2017-12-16 VITALS — BP 98/64 | HR 80 | Temp 99.6°F | Resp 18 | Ht 65.75 in | Wt 112.0 lb

## 2017-12-16 DIAGNOSIS — K3532 Acute appendicitis with perforation and localized peritonitis, without abscess: Secondary | ICD-10-CM | POA: Diagnosis not present

## 2017-12-16 DIAGNOSIS — R1031 Right lower quadrant pain: Secondary | ICD-10-CM | POA: Diagnosis not present

## 2017-12-16 DIAGNOSIS — R1012 Left upper quadrant pain: Secondary | ICD-10-CM | POA: Diagnosis not present

## 2017-12-16 MED ORDER — SODIUM CHLORIDE 0.9 % IV SOLN: 500.0000 mL | Freq: Once | INTRAVENOUS | Status: DC

## 2017-12-16 NOTE — Patient Instructions (Signed)
YOU HAD AN ENDOSCOPIC PROCEDURE TODAY AT THE [facility]:   Refer to the procedure report that was given to you for any specific questions about what was found during the examination.  If the procedure report does not answer your questions, please call your gastroenterologist to clarify.  If you requested that your care partner not be given the details of your procedure findings, then the procedure report has been included in a sealed envelope for you to review at your convenience later.  YOU SHOULD EXPECT: Some feelings of bloating in the abdomen. Passage of more gas than usual.  Walking can help get rid of the air that was put into your GI tract during the procedure and reduce the bloating. If you had a lower endoscopy (such as a colonoscopy or flexible sigmoidoscopy) you may notice spotting of blood in your stool or on the toilet paper. If you underwent a bowel prep for your procedure, you may not have a normal bowel movement for a few days.  Please Note:  You might notice some irritation and congestion in your nose or some drainage.  This is from the oxygen used during your procedure.  There is no need for concern and it should clear up in a day or so.  SYMPTOMS TO REPORT IMMEDIATELY:   Following lower endoscopy (colonoscopy or flexible sigmoidoscopy):  Excessive amounts of blood in the stool  Significant tenderness or worsening of abdominal pains  Swelling of the abdomen that is new, acute  Fever of 100F or higher  For urgent or emergent issues, a gastroenterologist can be reached at any hour by calling [phone].   DIET:  We do recommend a small meal at first, but then you may proceed to your regular diet.  Drink plenty of fluids but you should avoid alcoholic beverages for 24 hours.  ACTIVITY:  You should plan to take it easy for the rest of today and you should NOT DRIVE or use heavy machinery until tomorrow (because of the sedation medicines used during the test).     FOLLOW UP: Our staff will call the number listed on your records the next business day following your procedure to check on you and address any questions or concerns that you may have regarding the information given to you following your procedure. If we do not reach you, we will leave a message.  However, if you are feeling well and you are not experiencing any problems, there is no need to return our call.  We will assume that you have returned to your regular daily activities without incident.  If any biopsies were taken you will be contacted by phone or by letter within the next 1-3 weeks.  Please call us at [phone] if you have not heard about the biopsies in 3 weeks.    SIGNATURES/CONFIDENTIALITY: You and/or your care partner have signed paperwork which will be entered into your electronic medical record.  These signatures attest to the fact that that the information above on your After Visit Summary has been reviewed and is understood.  Full responsibility of the confidentiality of this discharge information lies with you and/or your care-partner. 

## 2017-12-16 NOTE — Progress Notes (Signed)
Pt felt he was unable to get up off of stretcher, get dressed or pivot to wheelchair so he was held longer than usual recovery time. Angela/Recovery

## 2017-12-16 NOTE — Op Note (Signed)
[facility] Patient Name: Bobby Owens Procedure Date: 12/16/2017 3:15 PM MRN: [id number] Endoscopist: Napoleon Form , MD Age: 22 Referring MD:  Date of Birth: Aug 23, 1995 Gender: Male Account #: [id number] Procedure:                Colonoscopy Indications:              Abnormal CT of the GI tract. History of perforated                            appendicitis. Appendicolith. Medicines:                Monitored Anesthesia Care Procedure:                Pre-Anesthesia Assessment:                           - Prior to the procedure, a History and Physical                            was performed, and patient medications and                            allergies were reviewed. The patient's tolerance of                            previous anesthesia was also reviewed. The risks                            and benefits of the procedure and the sedation                            options and risks were discussed with the patient.                            All questions were answered, and informed consent                            was obtained. Prior Anticoagulants: The patient has                            taken no previous anticoagulant or antiplatelet                            agents. ASA Grade Assessment: I - A normal, healthy                            patient. After reviewing the risks and benefits,                            the patient was deemed in satisfactory condition to                            undergo the procedure.  After obtaining informed consent, the colonoscope                            was passed under direct vision. Throughout the                            procedure, the patient's blood pressure, pulse, and                            oxygen saturations were monitored continuously. The                            Colonoscope was introduced through the anus and                            advanced to the the terminal ileum, with                             identification of the appendiceal orifice and IC                            valve. The colonoscopy was performed without                            difficulty. The patient tolerated the procedure                            well. The quality of the bowel preparation was                            good. The terminal ileum, ileocecal valve,                            appendiceal orifice, and rectum were photographed. Scope In: 3:37:52 PM Scope Out: 3:49:16 PM Scope Withdrawal Time: 0 hours 6 minutes 57 seconds  Total Procedure Duration: 0 hours 11 minutes 24 seconds  Findings:                 The perianal and digital rectal examinations were                            normal.                           A localized area of granular erythematous mucosa                            was found appendiceal orifice.                           The exam was otherwise without abnormality. Complications:            No immediate complications. Estimated Blood Loss:     Estimated blood loss: none. Impression:               - Granularity at the appendiceal orifice.                           -  The examination was otherwise normal.                           - No specimens collected. Recommendation:           - Patient has a contact number available for                            emergencies. The signs and symptoms of potential                            delayed complications were discussed with the                            patient. Return to normal activities tomorrow.                            Written discharge instructions were provided to the                            patient.                           - Resume previous diet.                           - Continue present medications.                           - Repeat colonoscopy at age 10 for screening                            purposes. Napoleon Form, MD 12/16/2017 3:55:21 PM This report has been signed  electronically. CC Letter to:             Stephanie Coup. White MD, MD

## 2017-12-16 NOTE — Progress Notes (Signed)
To PACU, VSS. Report to [email or web address] 

## 2017-12-17 ENCOUNTER — Telehealth: Payer: Self-pay | Admitting: *Deleted

## 2017-12-17 NOTE — Telephone Encounter (Signed)
  Follow up Call-  Call back number 12/16/2017 12/16/2017  Post procedure Call Back phone  # - [phone]  Permission to leave phone message Yes Yes  Some recent data might be hidden     Patient questions:  Do you have a fever, pain , or abdominal swelling? No. Pain Score  0 *  Have you tolerated food without any problems? No.  Have you been able to return to your normal activities? Yes.    Do you have any questions about your discharge instructions: Diet   No. Medications  No. Follow up visit  No.  Do you have questions or concerns about your Care? No.  Actions: * If pain score is 4 or above: No action needed, pain <4.

## 2018-01-29 DIAGNOSIS — K3532 Acute appendicitis with perforation and localized peritonitis, without abscess: Secondary | ICD-10-CM | POA: Diagnosis not present

## 2018-03-15 DIAGNOSIS — Z23 Encounter for immunization: Secondary | ICD-10-CM | POA: Diagnosis not present

## 2019-10-12 IMAGING — CT CT ABD-PELV W/ CM
1 of 2 series · 14 of 32 positions shown, 19 images · IV contrast (APPLIED)
Comparison: None.

CLINICAL DATA: Right lower quadrant pain

EXAM:
CT ABDOMEN AND PELVIS WITH CONTRAST
TECHNIQUE: Multidetector CT imaging of the abdomen and pelvis was performed
using the standard protocol following bolus administration of
intravenous contrast.
CONTRAST:  100 mL Isovue 370 nonionic

[Series 2: abd/pelvis w/cm · axial · 0.58mm/px · z∈[+725,+1100]mm · 14 of 85 slices shown, 19 images]
[im 5/85  soft-tissue]
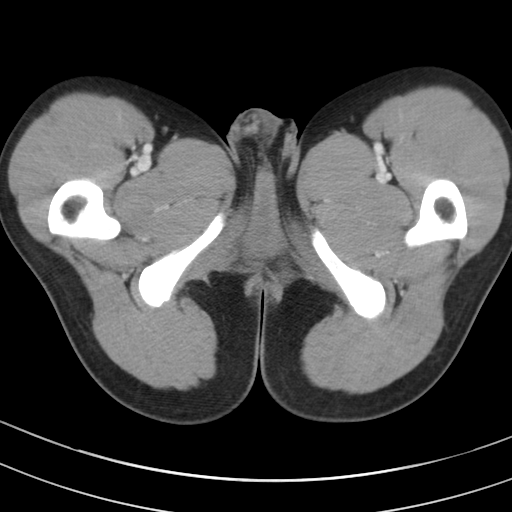
[im 5/85  bone]
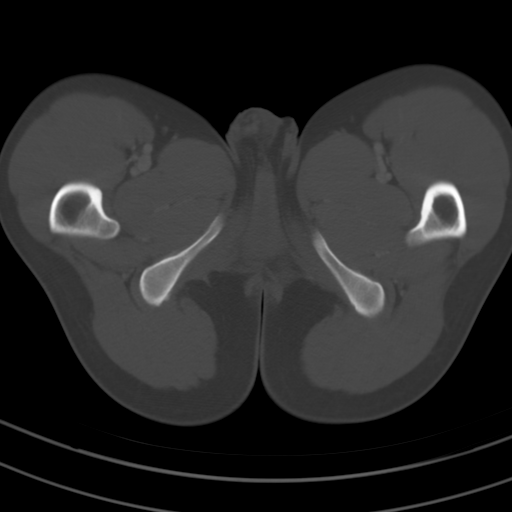
[im 13/85  soft-tissue]
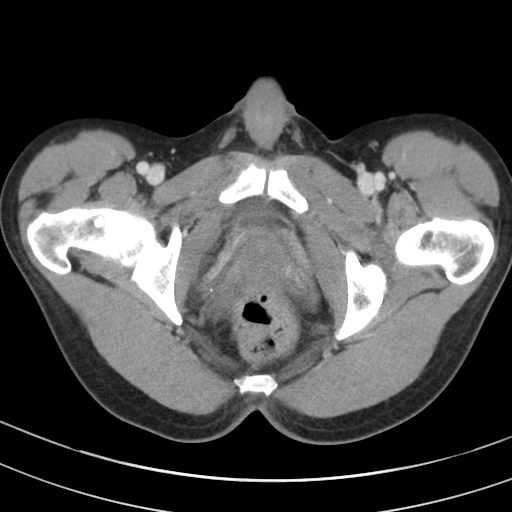
[im 17/85  soft-tissue]
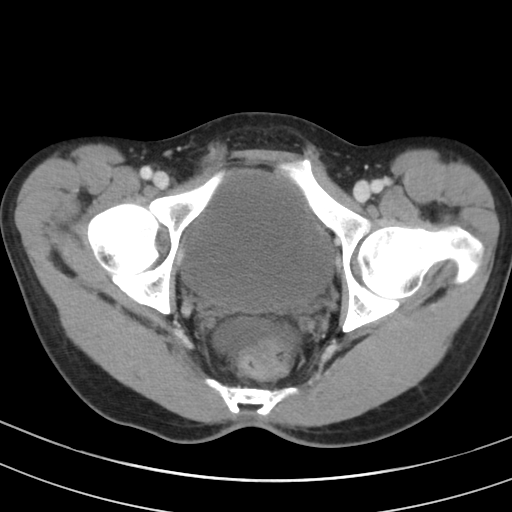
[im 26/85  soft-tissue]
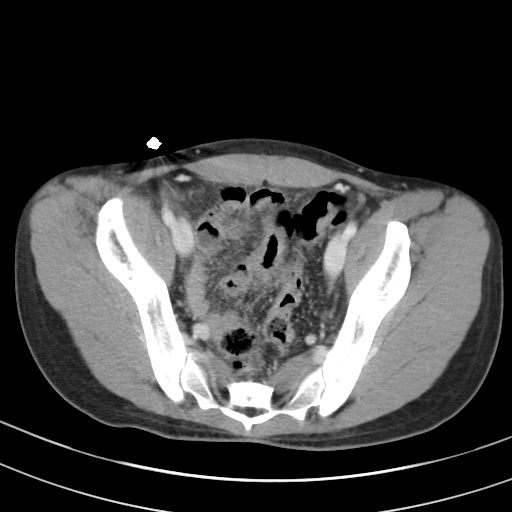
[im 30/85  soft-tissue]
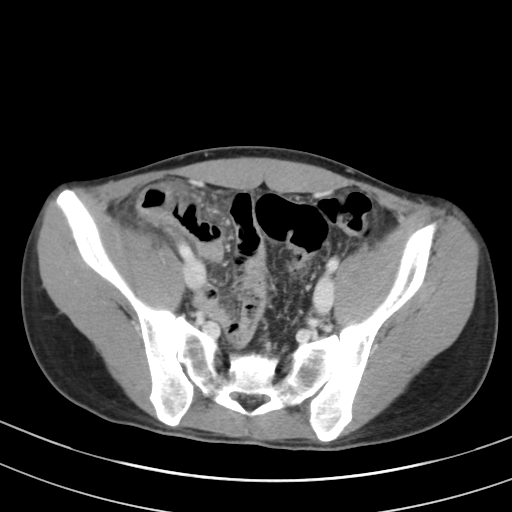
[im 38/85  soft-tissue]
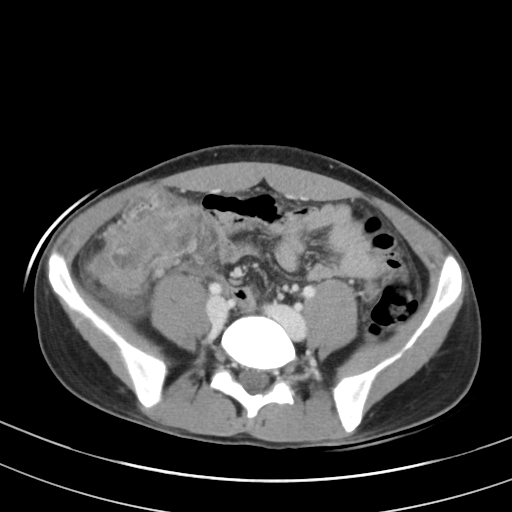
[im 43/85  soft-tissue]
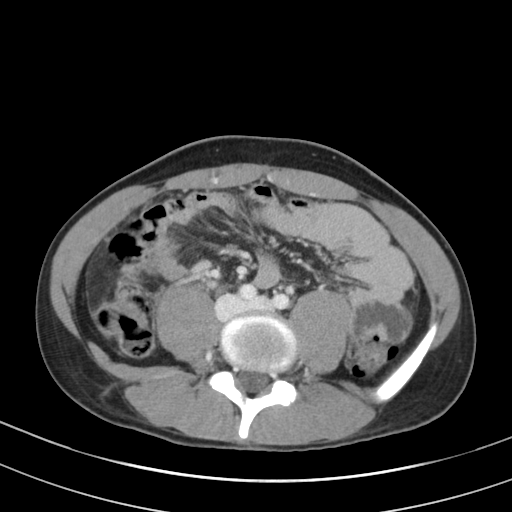
[im 47/85  soft-tissue]
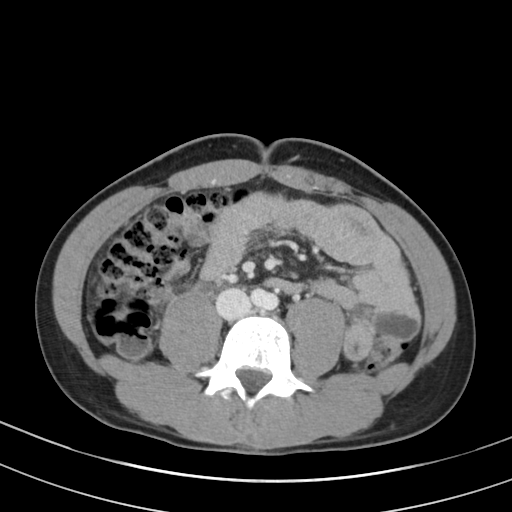
[im 55/85  soft-tissue]
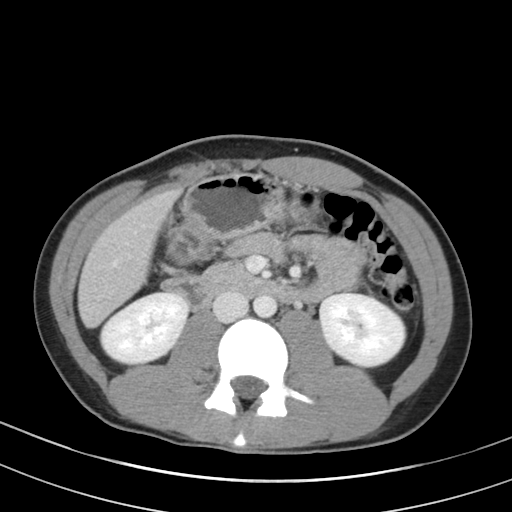
[im 55/85  bone]
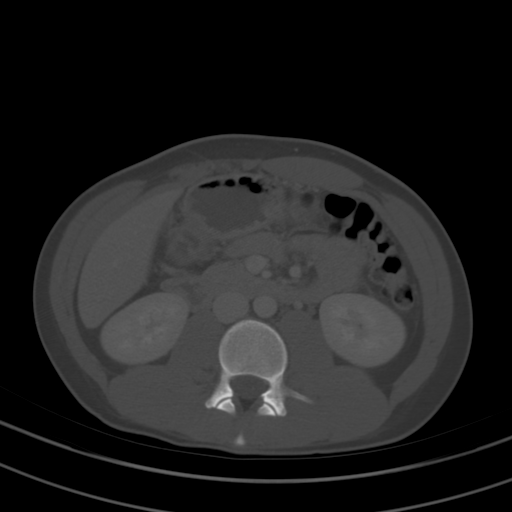
[im 59/85  soft-tissue]
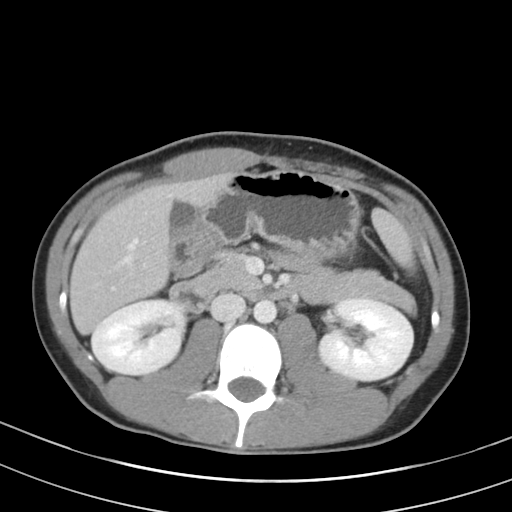
[im 68/85  soft-tissue]
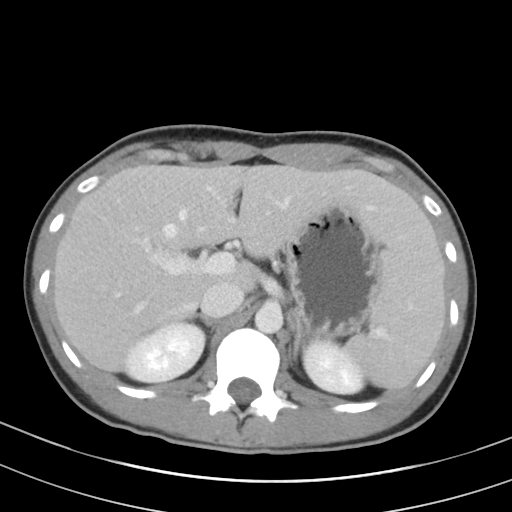
[im 68/85  lung]
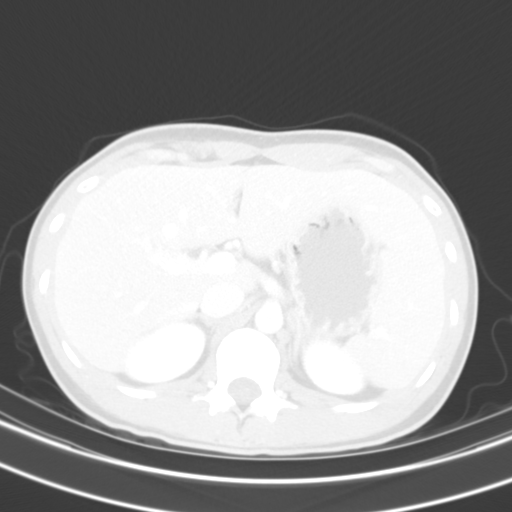
[im 72/85  soft-tissue]
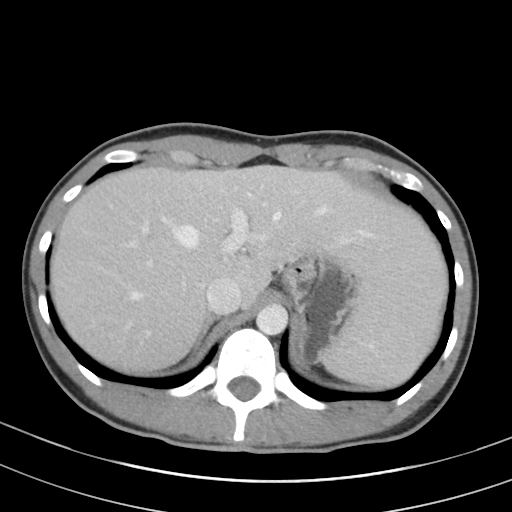
[im 72/85  lung]
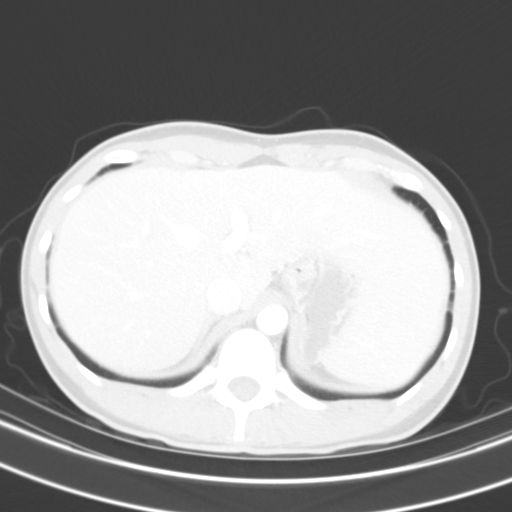
[im 76/85  lung]
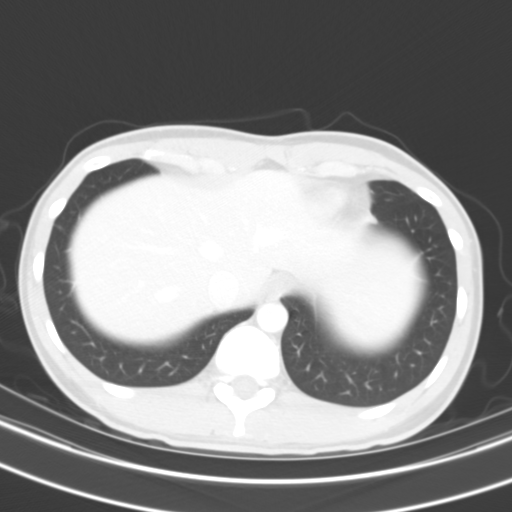
[im 80/85  soft-tissue]
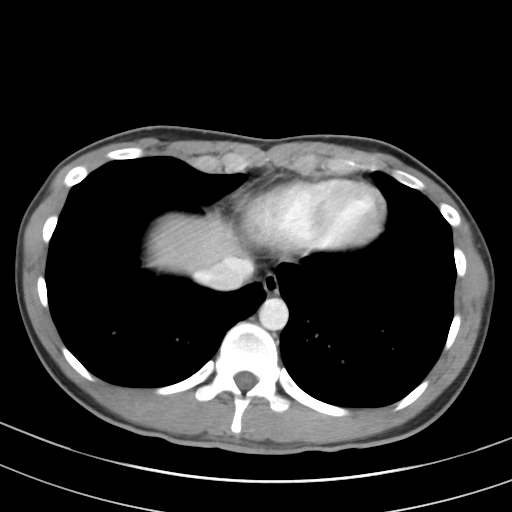
[im 80/85  lung]
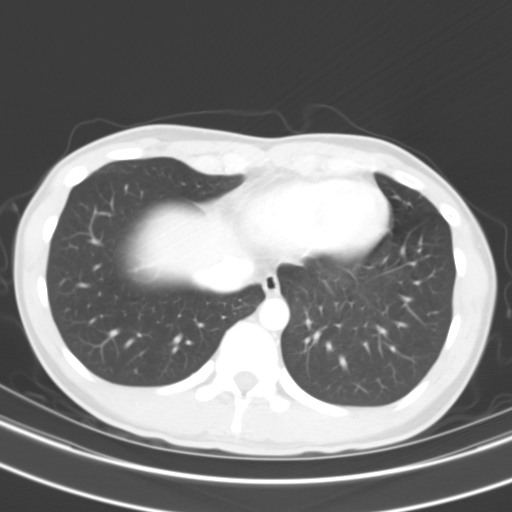

[14 of 32 positions shown; findings below may reference images not displayed]

FINDINGS: Lower chest: Lung bases are clear.

Hepatobiliary: No focal liver lesions are apparent. Gallbladder wall
is not appreciably thickened. There is no biliary duct dilatation.

Pancreas: No pancreatic mass or inflammatory focus.

Spleen: No splenic lesions are evident.

Adrenals/Urinary Tract: Adrenals bilaterally appear normal. Kidneys
bilaterally show no evident mass or hydronephrosis on either side.
There is no renal or ureteral calculus on either side. Urinary
bladder is midline with wall thickness within normal limits.

Stomach/Bowel: There is medial proximal colonic wall thickening due
to adjacent appendiceal abscess. There is no other appreciable bowel
wall thickening. No bowel obstruction evident. No free air or portal
venous air.

Vascular/Lymphatic: No abdominal aortic aneurysm. No vascular
lesions are evident. There is no adenopathy in the abdomen or
pelvis.

Reproductive: Prostate and seminal vesicles appear normal in size
and contour. No pelvic mass.

Other: There is a focal abscess in the right lower quadrant at the
site of the appendix measuring 5.7 x 4.3 x 4.1 cm. No other abscess
seen elsewhere. There is mild ascites in the dependent portion of
the pelvis.

Musculoskeletal: No blastic or lytic bone lesions are evident. No
intramuscular or abdominal wall lesion evident.
IMPRESSION: 1. Periappendiceal abscess measuring 5.7 x 4.3 x 4.1 cm. No other
abscess elsewhere.

2.  Mild ascites in the dependent portion of the pelvis.

3.  No bowel obstruction.  No free air.

Critical Value/emergent results were called by telephone at the time
of interpretation on 09/24/2017 at [DATE] to Dr. REEMII ROSHED ,
who verbally acknowledged these results.

## 2019-10-17 IMAGING — CT CT ABD-PELV W/ CM
2 of 4 series · 16 of 46 positions shown, 18 images · IV contrast (OMNIPAQUE)
Comparison: 09/24/2017

CLINICAL DATA: 21-year-old male with a history of ruptured
appendicitis and prior drainage

EXAM:
CT ABDOMEN AND PELVIS WITH CONTRAST
TECHNIQUE: Multidetector CT imaging of the abdomen and pelvis was performed
using the standard protocol following bolus administration of
intravenous contrast.
CONTRAST:  80mL OMNIPAQUE IOHEXOL 300 MG/ML  SOLN

[Series 2: axial st · axial · 0.62mm/px · z∈[-731,-346]mm · 13 of 87 slices shown, 15 images]
[im 5/87  soft-tissue]
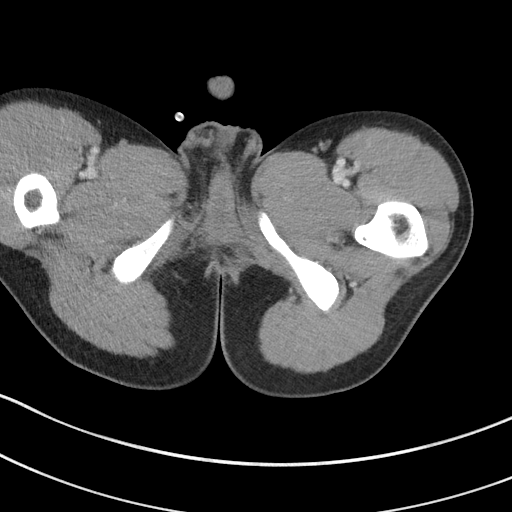
[im 5/87  bone]
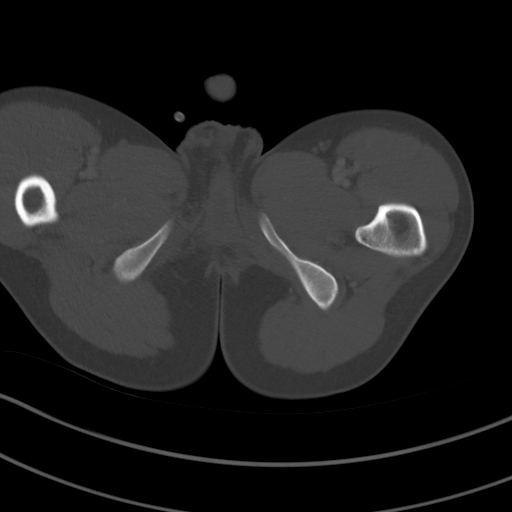
[im 13/87  soft-tissue]
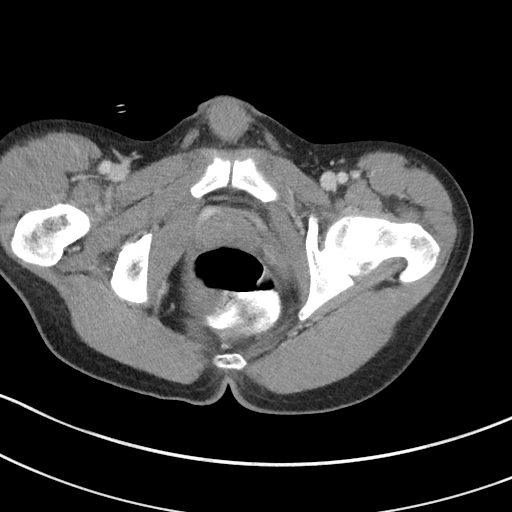
[im 17/87  soft-tissue]
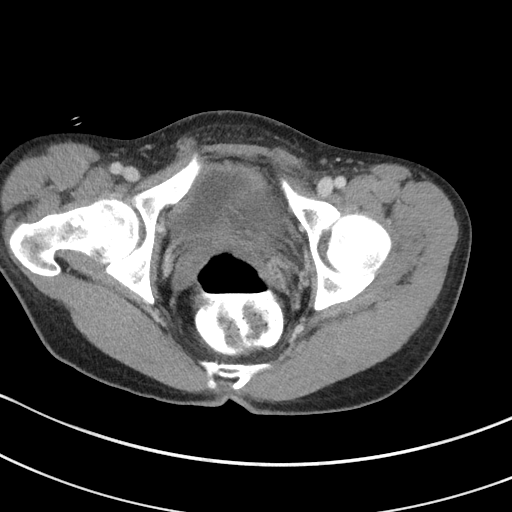
[im 25/87  soft-tissue]
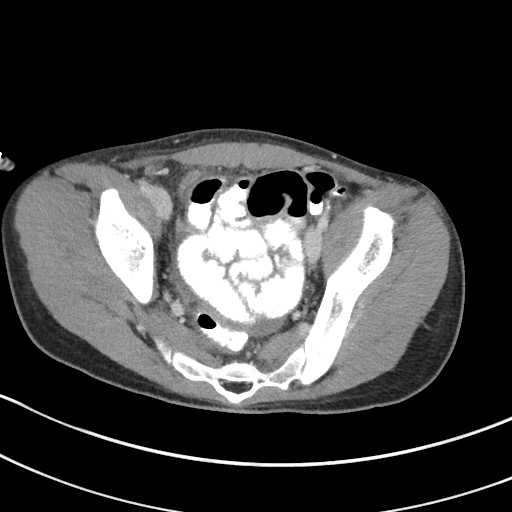
[im 29/87  soft-tissue]
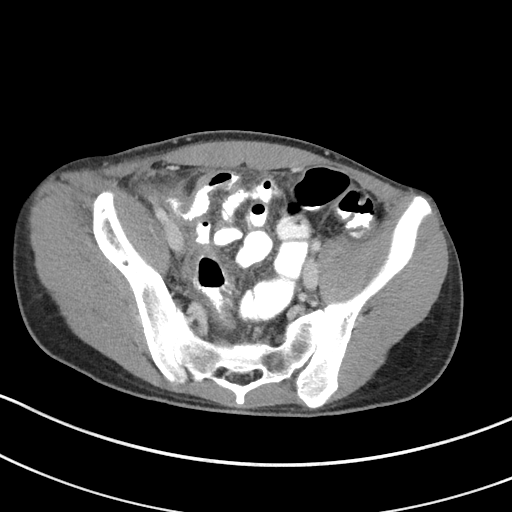
[im 37/87  soft-tissue]
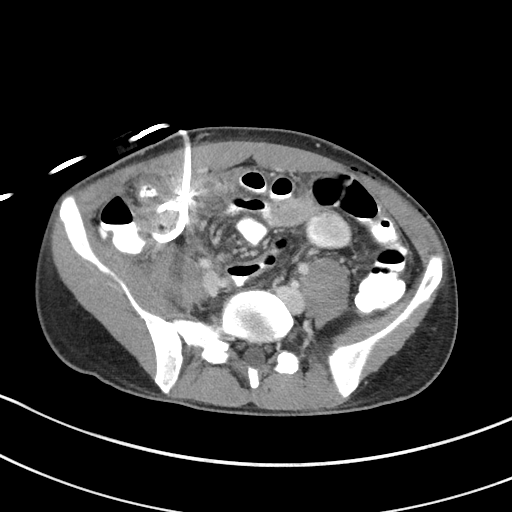
[im 46/87  soft-tissue]
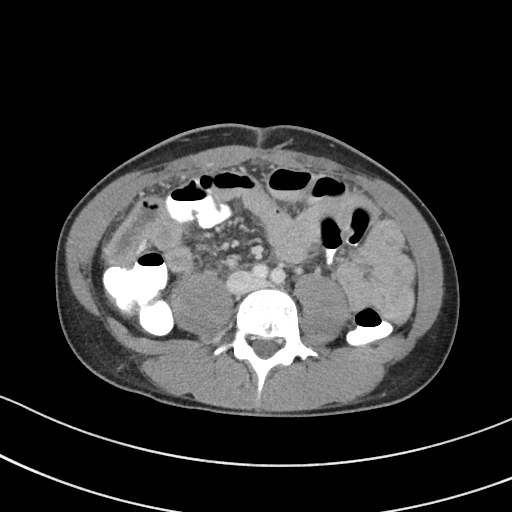
[im 50/87  soft-tissue]
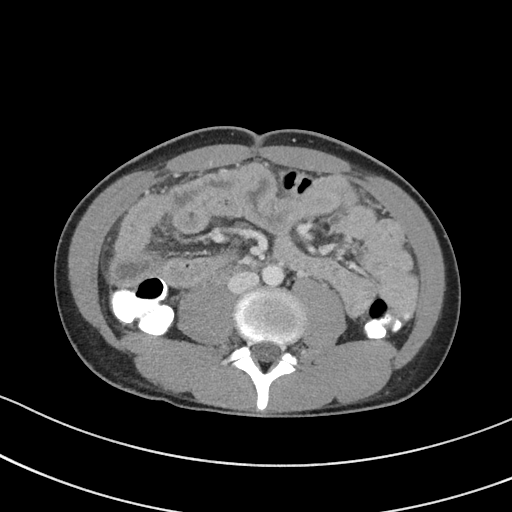
[im 58/87  soft-tissue]
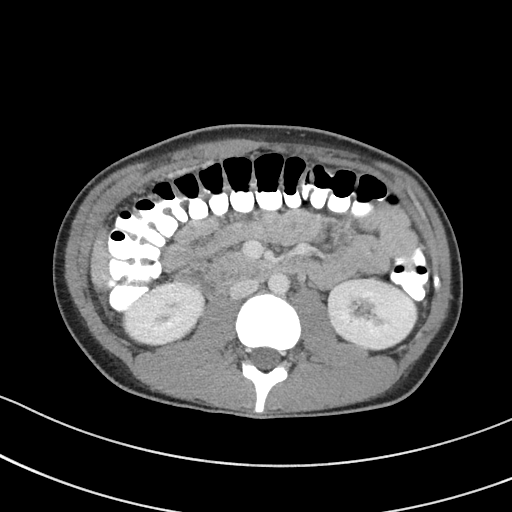
[im 58/87  bone]
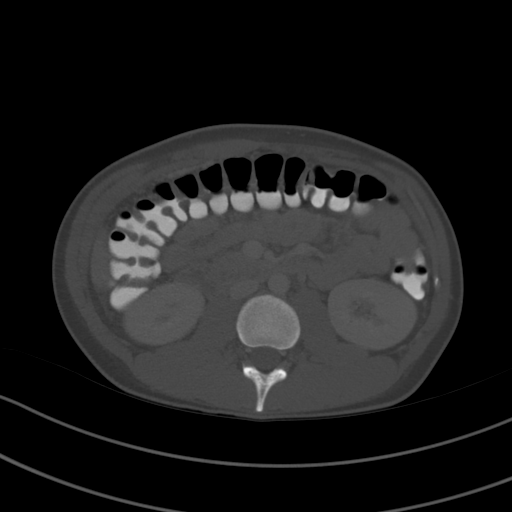
[im 62/87  soft-tissue]
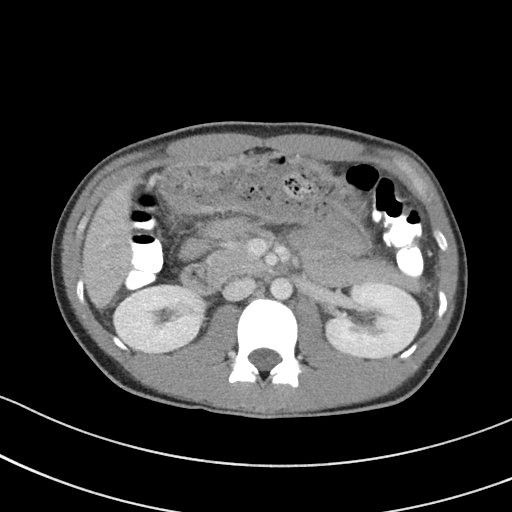
[im 70/87  soft-tissue]
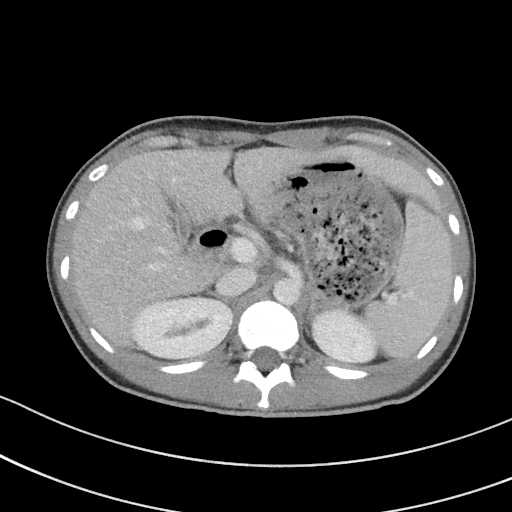
[im 74/87  soft-tissue]
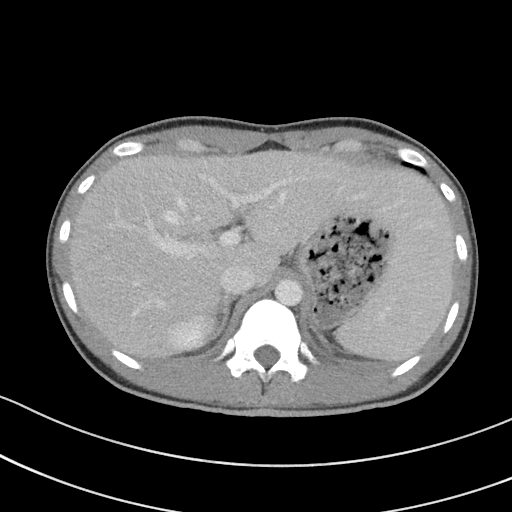
[im 82/87  soft-tissue]
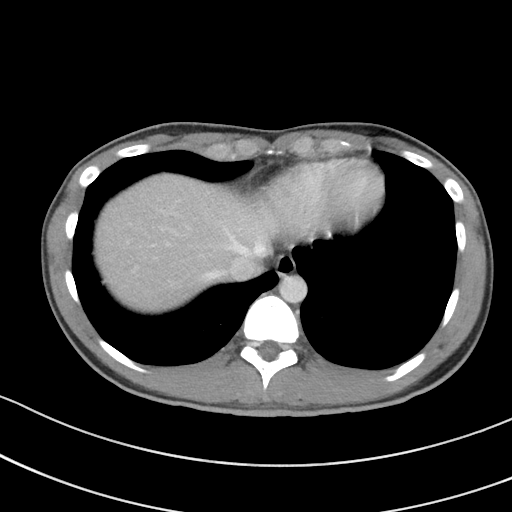

[Series 4: coronal st · coronal · 0.70mm/px · 3 of 60 slices shown]
[im 20/60  soft-tissue]
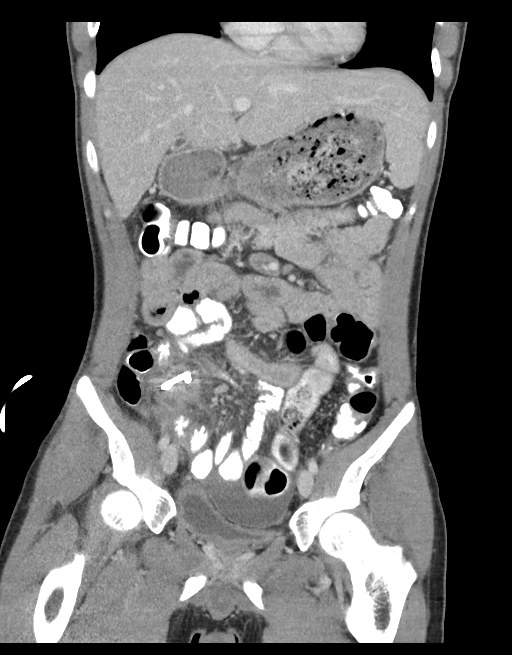
[im 27/60  soft-tissue]
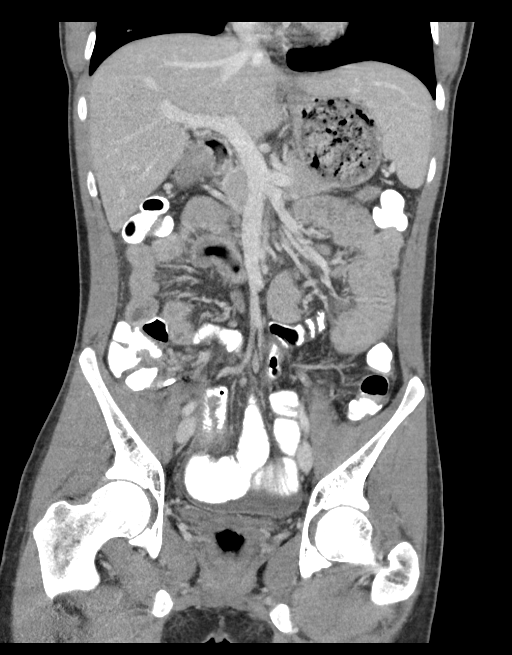
[im 33/60  soft-tissue]
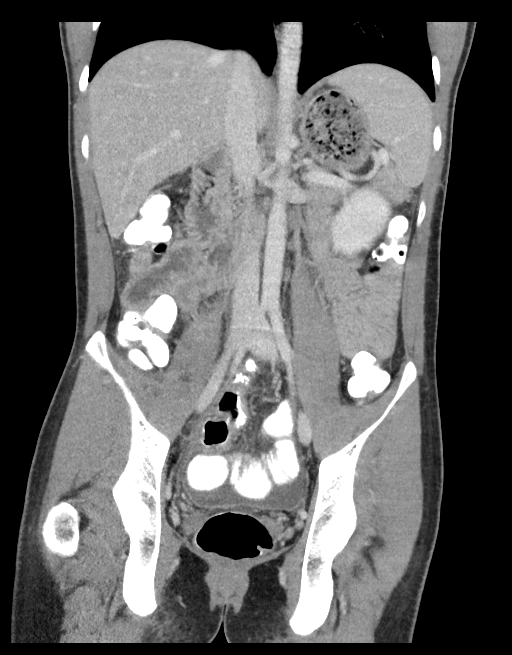

[16 of 46 positions shown; findings below may reference images not displayed]

FINDINGS: Lower chest: No acute abnormality.

Hepatobiliary: Unremarkable appearance of liver. Unremarkable
gallbladder which is decompressed.

Pancreas: Unremarkable pancreas

Spleen: Unremarkable spleen

Adrenals/Urinary Tract: Unremarkable appearance of the adrenal
glands. No evidence of hydronephrosis of the right or left kidney.
No nephrolithiasis. Unremarkable course of the bilateral ureters.
Unremarkable appearance of the urinary bladder.

Stomach/Bowel: Unremarkable appearance of stomach. Unremarkable
small bowel.

Enteric contrast reaches the colon extends to the rectum. No
evidence of obstruction.

Compared to the prior imaging there is significant reduction in the
fluid collection of the right lower quadrant adjacent to the cecum,
with unchanged position of the pigtail drainage catheter. Hyperdense
focus medial to the drain measures 5 mm, compatible with
appendicolith.

There is a small rim enhancing residual fluid collection just
inferior to the drain measuring 12 mm.

Lymph nodes within the mesentery. Persisting haziness and
inflammatory changes of the adjacent fat. No free air.

Vascular/Lymphatic: Unremarkable appearance of the vasculature.

No periaortic or preaortic lymph nodes. Small lymph nodes of the
mesentery of the right lower quadrant.

Reproductive: Unremarkable appearance the pelvic structures

Other: None

Musculoskeletal: No acute displaced fracture.
IMPRESSION: Unchanged position of pigtail drainage catheter status post drainage
of periappendiceal abscess. There is persisting mild inflammatory
changes with significantly decreased abscess collection, with only a
small residual fluid collection measuring 12 mm.

Appendicolith is present medial to the pigtail drainage catheter.

## 2019-10-25 IMAGING — CT CT ABD-PELV W/ CM
2 of 4 series · 15 of 46 positions shown, 17 images · IV contrast (iopamidol)
Comparison: CT 09/24/2017, 09/29/2017

CLINICAL DATA: 21-year-old male with a history of ruptured
appendicitis

EXAM:
CT ABDOMEN AND PELVIS WITH CONTRAST
TECHNIQUE: Multidetector CT imaging of the abdomen and pelvis was performed
using the standard protocol following bolus administration of
intravenous contrast.
CONTRAST:  100mL [id number] IOPAMIDOL ([id number]-[id number]) INJECTION 61%

[Series 2: abd pelvis 5.00 br40 s3 ax · axial · 0.55mm/px · z∈[+1329,+1704]mm · 12 of 86 slices shown, 14 images]
[im 7/86  soft-tissue]
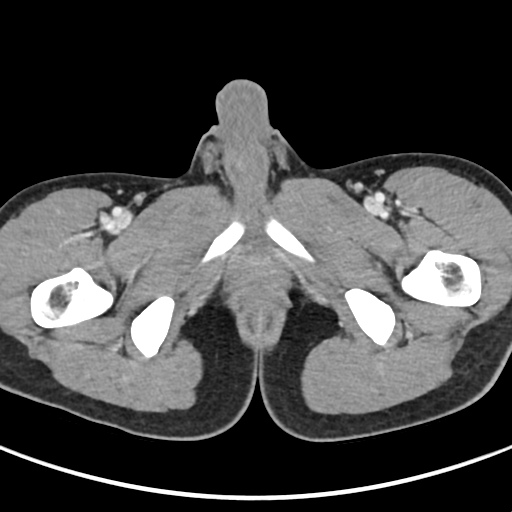
[im 7/86  bone]
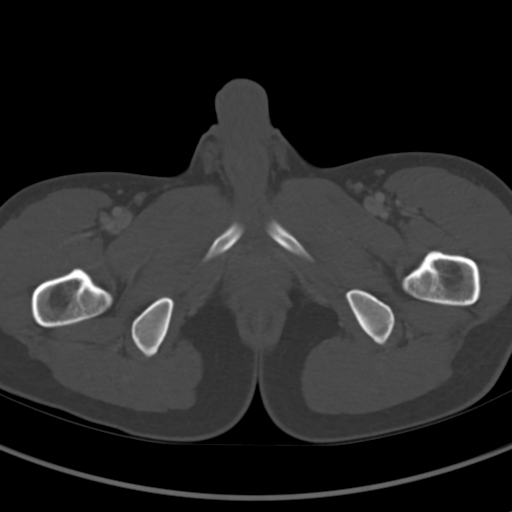
[im 14/86  soft-tissue]
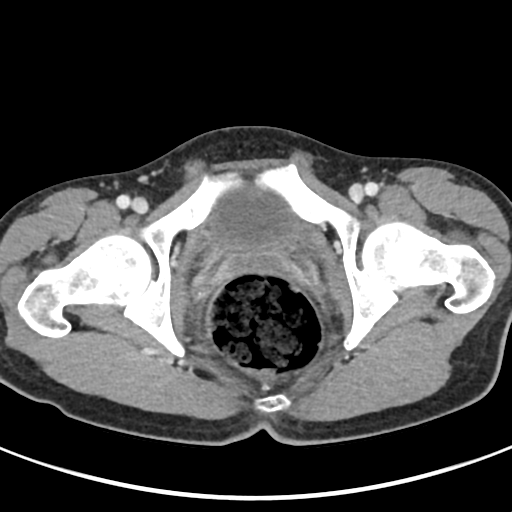
[im 21/86  soft-tissue]
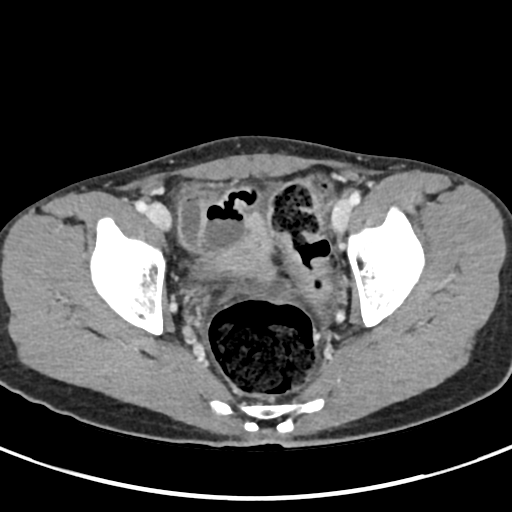
[im 28/86  soft-tissue]
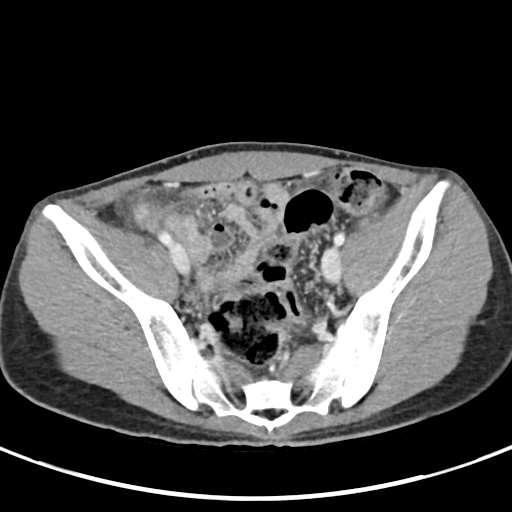
[im 35/86  soft-tissue]
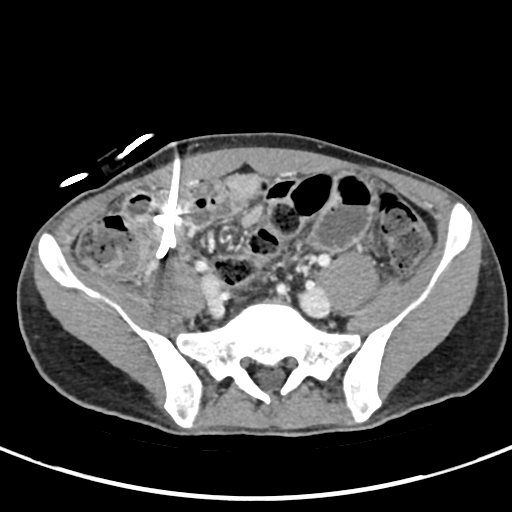
[im 41/86  soft-tissue]
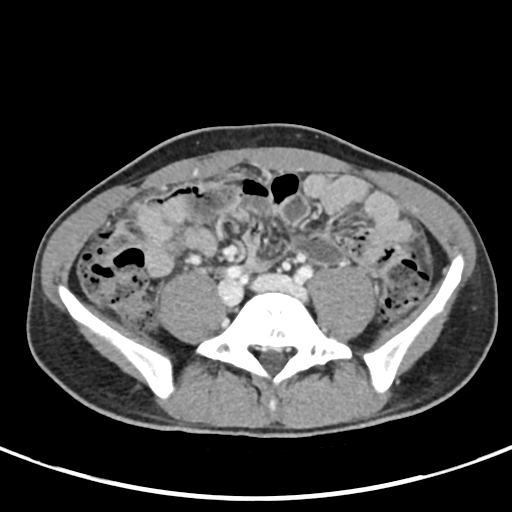
[im 48/86  soft-tissue]
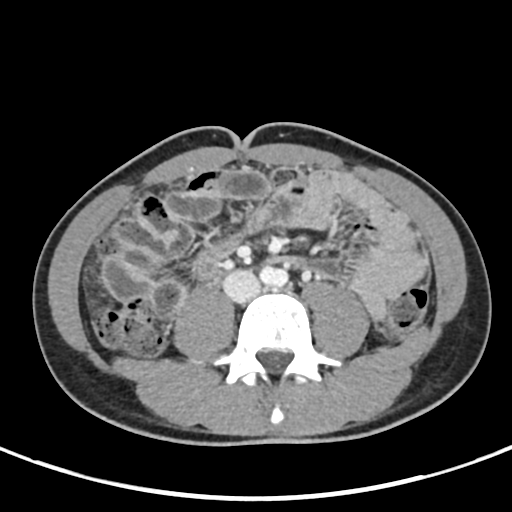
[im 55/86  soft-tissue]
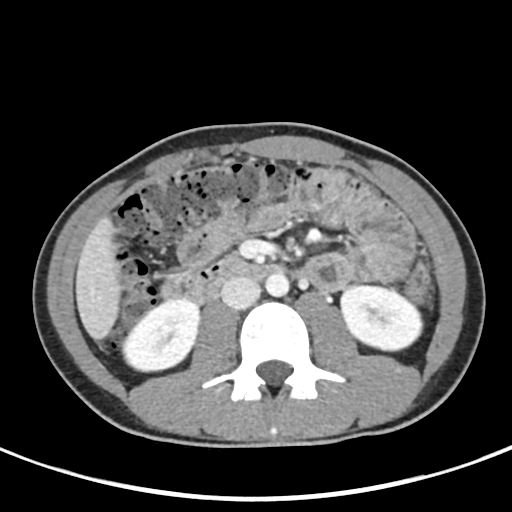
[im 62/86  soft-tissue]
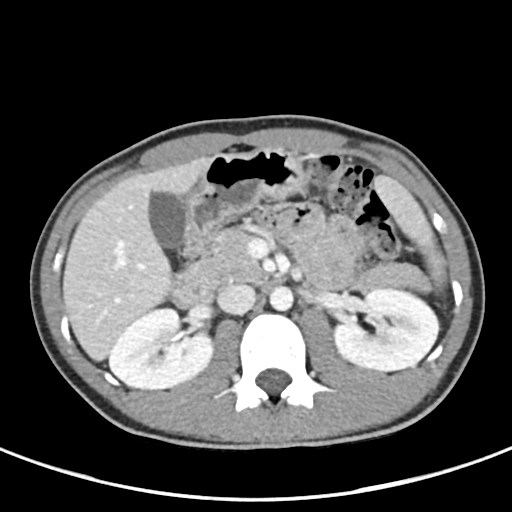
[im 62/86  bone]
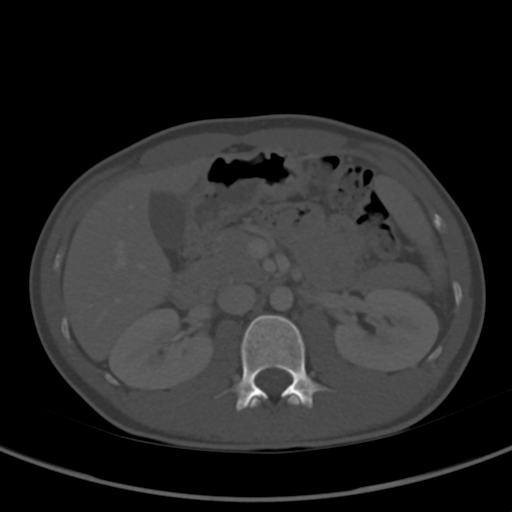
[im 69/86  soft-tissue]
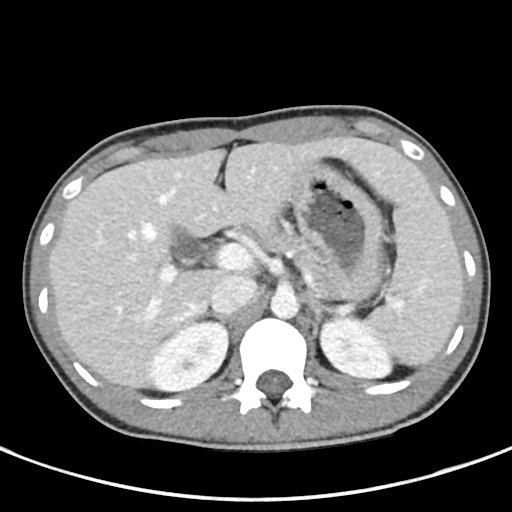
[im 75/86  soft-tissue]
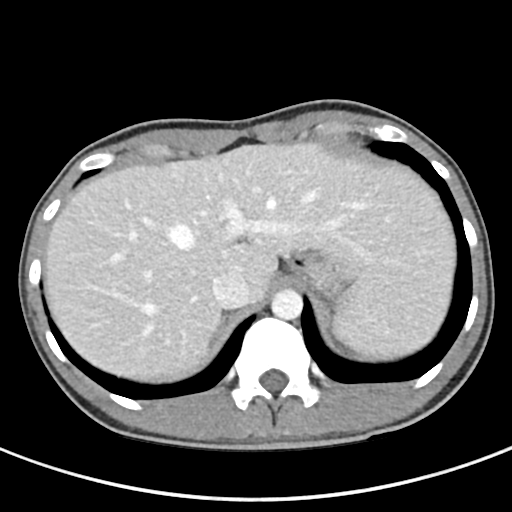
[im 82/86  soft-tissue]
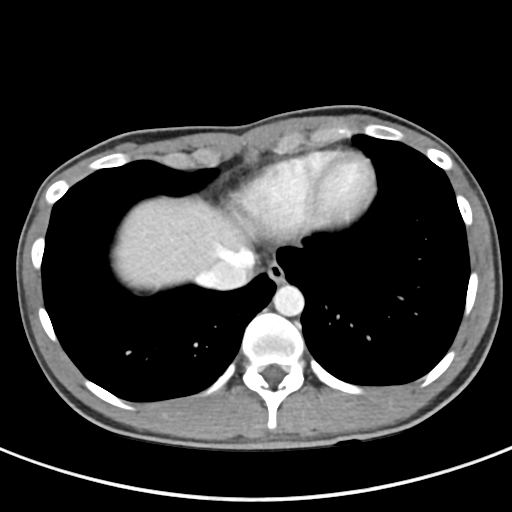

[Series 6: abd pelvis 2.00 br40 s3 cor · coronal · 0.55mm/px · 3 of 97 slices shown]
[im 33/97  soft-tissue]
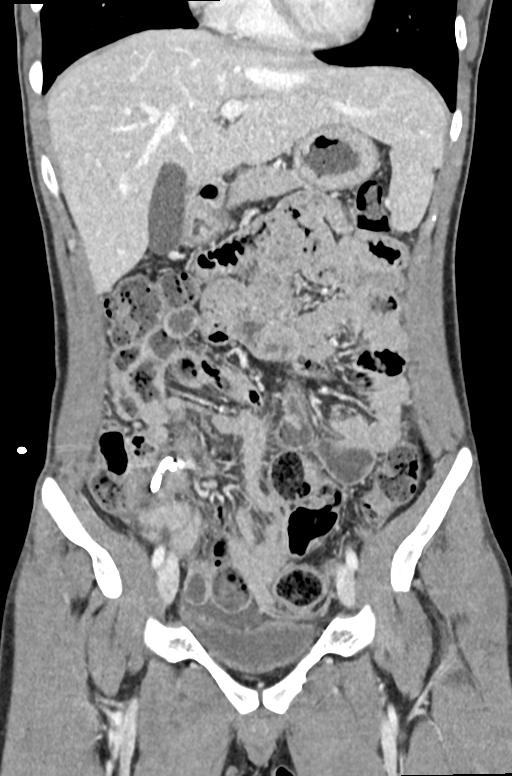
[im 43/97  soft-tissue]
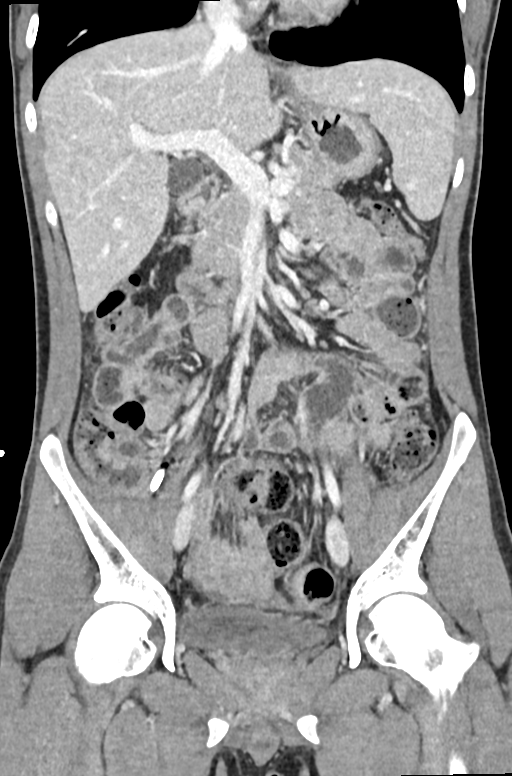
[im 54/97  soft-tissue]
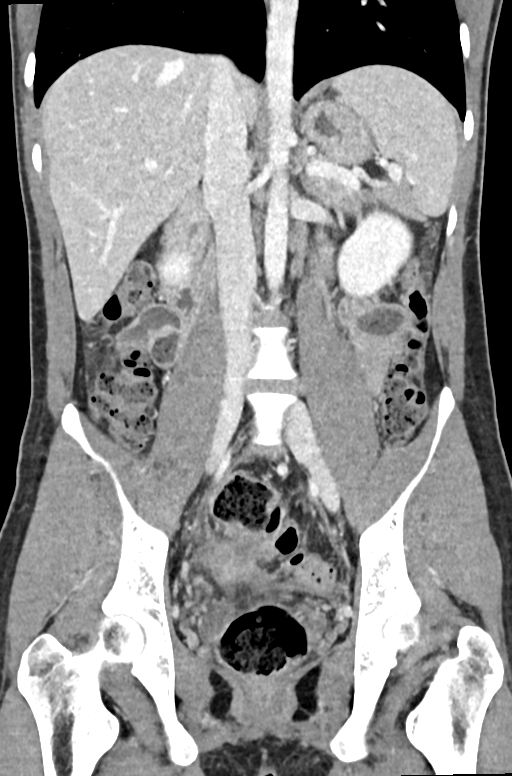

[15 of 46 positions shown; findings below may reference images not displayed]

FINDINGS: Lower chest: No acute abnormality.

Hepatobiliary: Unremarkable liver.  Unremarkable gallbladder

Pancreas: Unremarkable pancreas

Spleen: Unremarkable spleen

Adrenals/Urinary Tract: Unremarkable appearance of the adrenal
glands. No evidence of hydronephrosis of the right or left kidney.
No nephrolithiasis. Unremarkable course of the bilateral ureters.
Unremarkable appearance of the urinary bladder.

Stomach/Bowel: Unremarkable stomach. Unremarkable small bowel. No
abnormal distention. No focal wall thickening. No inflammatory
changes.

Unchanged position of pigtail catheter within the right lower
quadrant adjacent to the cecum. Hyperdense focus on image 54 again
favored to represent retained appendicolith. There has been complete
resolution of fluid collection with only trace haziness within the
adjacent fat. No separate rim enhancing fluid collection.

Vascular/Lymphatic: No significant vascular findings are present. No
enlarged abdominal or pelvic lymph nodes.

Reproductive: Unremarkable pelvic structures

Other: No abdominal wall hernia or abnormality. No abdominopelvic
ascites.

Musculoskeletal: No acute or significant osseous findings.
IMPRESSION: Unchanged pigtail drainage catheter in the right lower quadrant,
with interval resolution of periappendiceal abscess, and only
minimal residual edema/inflammation. Similar location of
appendicolith, medial to the drain.

## 2023-08-17 ENCOUNTER — Ambulatory Visit: Payer: Commercial Managed Care - PPO | Admitting: Family Medicine

## 2023-08-18 ENCOUNTER — Encounter: Payer: Self-pay | Admitting: Family Medicine

## 2023-08-18 ENCOUNTER — Ambulatory Visit: Admitting: Family Medicine

## 2023-08-18 VITALS — BP 118/70 | HR 65 | Temp 97.7°F | Ht 66.25 in | Wt 149.4 lb

## 2023-08-18 DIAGNOSIS — E785 Hyperlipidemia, unspecified: Secondary | ICD-10-CM

## 2023-08-18 DIAGNOSIS — R748 Abnormal levels of other serum enzymes: Secondary | ICD-10-CM | POA: Diagnosis not present

## 2023-08-18 DIAGNOSIS — Z0000 Encounter for general adult medical examination without abnormal findings: Secondary | ICD-10-CM | POA: Diagnosis not present

## 2023-08-18 NOTE — Patient Instructions (Addendum)
 You are due for Td (tetanus) and varicella (chicken pox) vaccines You can get those at your county health dept    Take care of yourself  Keep exercising  Add more resistance / strength training   Labs today for wellness / cholesterol screening   Blood pressure is good on the 2nd check

## 2023-08-18 NOTE — Assessment & Plan Note (Signed)
 Reviewed health habits including diet and exercise and skin cancer prevention Reviewed appropriate screening tests for age  Also reviewed health mt list, fam hx and immunization status , as well as social and family history   See HPI Labs reviewed and ordered Health Maintenance  Topic Date Due   Hepatitis C Screening  Never done   Flu Shot  08/24/2023*   DTaP/Tdap/Td vaccine (7 - Td or Tdap) 08/17/2024*   COVID-19 Vaccine (3 - 2024-25 season) 09/02/2024*   HIV Screening  Completed   HPV Vaccine  Aged Out  *Topic was postponed. The date shown is not the original due date.   Declines hpv screening  Due for Td and varicella vaccine- will get at health dept in light of medicaid status (cannot get imms here) Encouraged more muscle building exercise  Wellness labs today  Encouraged yearly eye care Encouraged dental visits every 6 months

## 2023-08-18 NOTE — Progress Notes (Signed)
 Subjective:    Patient ID: Bobby Owens, male    DOB: 09-Oct-1995, 28 y.o.   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 [employer] Jogging  Some TV  Single / no kids  Takes fair care of himself    Is a healthy eater  Not a lot of fast food   Has never had cholesterol checked before     Has medicaid= cannot get vaccines here   Has history of appy /perforated appendix in past  Lab Results  Component Value Date   WBC 6.0 09/29/2017   HGB 12.8 (L) 09/29/2017   HCT 38.3 (L) 09/29/2017   MCV 86.7 09/29/2017   PLT 340 09/29/2017   Lab Results  Component Value Date   NA 135 09/26/2017   K 3.9 09/26/2017   CO2 18 (L) 09/26/2017   GLUCOSE 107 (H) 09/26/2017   BUN 8 09/26/2017   CREATININE 0.73 09/26/2017   CALCIUM 8.9 09/26/2017   GFRNONAA >60 09/26/2017    Hep C screen - declines  HIV screen neg in 2019  Declines std screen / low risk    Flu shot -declines   Tetanus shot 2008 -due for   Had one varicella vaccine in 1998  never had 2nd   Non smoker No etoh  No drugs   Family history  Father with HTN  MGM MI  PGM cva   Vision care -wears glasses / last visit 3 years   Skin care  No skin cancer in the past  Not good about sunscreen  Does like being outside   No asthma or allergies before but notes some issues with tree pollen  No mental health issues   Not vegetarian   Has slacked off on dentist / used to go      Patient Active Problem List   Diagnosis Date Noted   Routine general medical examination  at a health care facility 08/18/2023   Past Medical History:  Diagnosis Date   Appendicitis with perforation    Past Surgical History:  Procedure Laterality Date   APPENDECTOMY     IR RADIOLOGIST EVAL & MGMT  10/07/2017   Social History   Tobacco Use   Smoking status: Never   Smokeless tobacco: Never  Vaping Use   Vaping status: Never Used  Substance Use Topics   Alcohol use: Never   Drug use: Never   Family History  Problem Relation Age of Onset   Hypertension Father    Heart attack Maternal Grandfather    Stroke Paternal Grandmother    No Known Allergies No current outpatient medications on file prior to visit.   No current facility-administered medications on file prior to visit.    Review of Systems  Constitutional:  Negative for activity change, appetite change, fatigue, fever and unexpected weight change.  HENT:  Negative for congestion, rhinorrhea, sore throat and trouble swallowing.  Eyes:  Negative for pain, redness, itching and visual disturbance.  Respiratory:  Negative for cough, chest tightness, shortness of breath and wheezing.   Cardiovascular:  Negative for chest pain and palpitations.  Gastrointestinal:  Negative for abdominal pain, blood in stool, constipation, diarrhea and nausea.  Endocrine: Negative for cold intolerance, heat intolerance, polydipsia and polyuria.  Genitourinary:  Negative for difficulty urinating, dysuria, frequency and urgency.  Musculoskeletal:  Negative for arthralgias, joint swelling and myalgias.  Skin:  Negative for pallor and rash.  Neurological:  Negative for dizziness, tremors, weakness, numbness and headaches.  Hematological:  Negative for adenopathy. Does not bruise/bleed easily.  Psychiatric/Behavioral:  Negative for decreased concentration and dysphoric mood. The patient is not nervous/anxious.        Objective:   Physical Exam Constitutional:      General: He is not in acute distress.    Appearance: Normal  appearance. He is well-developed and normal weight. He is not ill-appearing or diaphoretic.  HENT:     Head: Normocephalic and atraumatic.     Right Ear: Tympanic membrane, ear canal and external ear normal.     Left Ear: Tympanic membrane, ear canal and external ear normal.     Nose: Nose normal. No congestion.     Mouth/Throat:     Mouth: Mucous membranes are moist.     Pharynx: Oropharynx is clear. No posterior oropharyngeal erythema.  Eyes:     General: No scleral icterus.       Right eye: No discharge.        Left eye: No discharge.     Conjunctiva/sclera: Conjunctivae normal.     Pupils: Pupils are equal, round, and reactive to light.  Neck:     Thyroid: No thyromegaly.     Vascular: No carotid bruit or JVD.  Cardiovascular:     Rate and Rhythm: Normal rate and regular rhythm.     Pulses: Normal pulses.     Heart sounds: Normal heart sounds.     No gallop.  Pulmonary:     Effort: Pulmonary effort is normal. No respiratory distress.     Breath sounds: Normal breath sounds. No wheezing or rales.     Comments: Good air exch Chest:     Chest wall: No tenderness.  Abdominal:     General: Bowel sounds are normal. There is no distension or abdominal bruit.     Palpations: Abdomen is soft. There is no mass.     Tenderness: There is no abdominal tenderness.     Hernia: No hernia is present.  Musculoskeletal:        General: No tenderness.     Cervical back: Normal range of motion and neck supple. No rigidity. No muscular tenderness.     Right lower leg: No edema.     Left lower leg: No edema.  Lymphadenopathy:     Cervical: No cervical adenopathy.  Skin:    General: Skin is warm and dry.     Coloration: Skin is not pale.     Findings: No erythema or rash.     Comments: Few brown nevi on back  Scattered lentigines   Neurological:     Mental Status: He is alert.     Cranial Nerves: No cranial nerve deficit.     Motor: No abnormal muscle tone.     Coordination:  Coordination normal.     Gait: Gait normal.     Deep Tendon Reflexes: Reflexes are normal and symmetric. Reflexes normal.  Psychiatric:        Mood and Affect: Mood normal.        Cognition and Memory: Cognition and memory normal.           Assessment & Plan:   Problem List Items Addressed This Visit       Other   Routine general medical examination at a health care facility - Primary   Reviewed health habits including diet and exercise and skin cancer prevention Reviewed appropriate screening tests for age  Also reviewed health mt list, fam hx and immunization status , as well as social and family history   See HPI Labs reviewed and ordered Health Maintenance  Topic Date Due   Hepatitis C Screening  Never done   Flu Shot  08/24/2023*   DTaP/Tdap/Td vaccine (7 - Td or Tdap) 08/17/2024*   COVID-19 Vaccine (3 - 2024-25 season) 09/02/2024*   HIV Screening  Completed   HPV Vaccine  Aged Out  *Topic was postponed. The date shown is not the original due date.   Declines hpv screening  Due for Td and varicella vaccine- will get at health dept in light of medicaid status (cannot get imms here) Encouraged more muscle building exercise  Wellness labs today  Encouraged yearly eye care Encouraged dental visits every 6 months       Relevant Orders   TSH   Lipid panel   Comprehensive metabolic panel   CBC with Differential/Platelet

## 2023-08-19 LAB — CBC WITH DIFFERENTIAL/PLATELET
Basophils Absolute: 0 10*3/uL (ref 0.0–0.1)
Basophils Relative: 0.3 % (ref 0.0–3.0)
Eosinophils Absolute: 0.2 10*3/uL (ref 0.0–0.7)
Eosinophils Relative: 3.1 % (ref 0.0–5.0)
HCT: 46.3 % (ref 39.0–52.0)
Hemoglobin: 15.7 g/dL (ref 13.0–17.0)
Lymphocytes Relative: 25.3 % (ref 12.0–46.0)
Lymphs Abs: 1.5 10*3/uL (ref 0.7–4.0)
MCHC: 33.9 g/dL (ref 30.0–36.0)
MCV: 88.5 fl (ref 78.0–100.0)
Monocytes Absolute: 0.6 10*3/uL (ref 0.1–1.0)
Monocytes Relative: 9.3 % (ref 3.0–12.0)
Neutro Abs: 3.8 10*3/uL (ref 1.4–7.7)
Neutrophils Relative %: 62 % (ref 43.0–77.0)
Platelets: 318 10*3/uL (ref 150.0–400.0)
RBC: 5.23 Mil/uL (ref 4.22–5.81)
RDW: 12.6 % (ref 11.5–15.5)
WBC: 6.1 10*3/uL (ref 4.0–10.5)

## 2023-08-19 LAB — COMPREHENSIVE METABOLIC PANEL WITH GFR
ALT: 15 U/L (ref 0–53)
AST: 16 U/L (ref 0–37)
Albumin: 4.8 g/dL (ref 3.5–5.2)
Alkaline Phosphatase: 137 U/L — ABNORMAL HIGH (ref 39–117)
BUN: 12 mg/dL (ref 6–23)
CO2: 28 meq/L (ref 19–32)
Calcium: 9.2 mg/dL (ref 8.4–10.5)
Chloride: 104 meq/L (ref 96–112)
Creatinine, Ser: 0.73 mg/dL (ref 0.40–1.50)
GFR: 124.44 mL/min (ref >60.00)
Glucose, Bld: 100 mg/dL — ABNORMAL HIGH (ref 70–99)
Potassium: 4.1 meq/L (ref 3.5–5.1)
Sodium: 141 meq/L (ref 135–145)
Total Bilirubin: 0.3 mg/dL (ref 0.2–1.2)
Total Protein: 7.1 g/dL (ref 6.0–8.3)

## 2023-08-19 LAB — LIPID PANEL
Cholesterol: 210 mg/dL — ABNORMAL HIGH (ref 0–200)
HDL: 42.5 mg/dL (ref >39.00)
LDL Cholesterol: 107 mg/dL — ABNORMAL HIGH (ref 0–99)
NonHDL: 167.62
Total CHOL/HDL Ratio: 5
Triglycerides: 305 mg/dL — ABNORMAL HIGH (ref 0.0–149.0)
VLDL: 61 mg/dL — ABNORMAL HIGH (ref 0.0–40.0)

## 2023-08-20 DIAGNOSIS — R748 Abnormal levels of other serum enzymes: Secondary | ICD-10-CM | POA: Insufficient documentation

## 2023-08-20 DIAGNOSIS — E785 Hyperlipidemia, unspecified: Secondary | ICD-10-CM | POA: Insufficient documentation

## 2023-08-20 LAB — TSH: TSH: 0.95 u[IU]/mL (ref 0.35–5.50)

## 2023-08-20 NOTE — Addendum Note (Signed)
 Addended by: Roxy Manns A on: 08/20/2023 08:11 PM   Modules accepted: Orders

## 2023-08-25 ENCOUNTER — Encounter: Payer: Self-pay | Admitting: *Deleted

## 2023-08-25 ENCOUNTER — Encounter: Payer: Self-pay | Admitting: Family Medicine
